# Patient Record
Sex: Female | Born: 1970
Health system: Southern US, Community
[De-identification: ages and names within clinical notes are randomized; demographics above are authoritative.]

## PROBLEM LIST (undated history)

## (undated) DIAGNOSIS — E7800 Pure hypercholesterolemia, unspecified: Secondary | ICD-10-CM

## (undated) DIAGNOSIS — K219 Gastro-esophageal reflux disease without esophagitis: Secondary | ICD-10-CM

## (undated) DIAGNOSIS — M1990 Unspecified osteoarthritis, unspecified site: Secondary | ICD-10-CM

## (undated) DIAGNOSIS — I2510 Atherosclerotic heart disease of native coronary artery without angina pectoris: Secondary | ICD-10-CM

## (undated) DIAGNOSIS — N920 Excessive and frequent menstruation with regular cycle: Secondary | ICD-10-CM

## (undated) DIAGNOSIS — I10 Essential (primary) hypertension: Secondary | ICD-10-CM

## (undated) DIAGNOSIS — J45909 Unspecified asthma, uncomplicated: Secondary | ICD-10-CM

## (undated) DIAGNOSIS — G4730 Sleep apnea, unspecified: Secondary | ICD-10-CM

## (undated) DIAGNOSIS — F329 Major depressive disorder, single episode, unspecified: Secondary | ICD-10-CM

## (undated) DIAGNOSIS — C801 Malignant (primary) neoplasm, unspecified: Secondary | ICD-10-CM

## (undated) DIAGNOSIS — G43909 Migraine, unspecified, not intractable, without status migrainosus: Secondary | ICD-10-CM

## (undated) DIAGNOSIS — I252 Old myocardial infarction: Secondary | ICD-10-CM

## (undated) DIAGNOSIS — F419 Anxiety disorder, unspecified: Secondary | ICD-10-CM

## (undated) DIAGNOSIS — Z460 Encounter for fitting and adjustment of spectacles and contact lenses: Secondary | ICD-10-CM

## (undated) DIAGNOSIS — E669 Obesity, unspecified: Secondary | ICD-10-CM

## (undated) DIAGNOSIS — R110 Nausea: Secondary | ICD-10-CM

## (undated) DIAGNOSIS — R0602 Shortness of breath: Secondary | ICD-10-CM

## (undated) DIAGNOSIS — G44099 Other trigeminal autonomic cephalgias (TAC), not intractable: Secondary | ICD-10-CM

## (undated) DIAGNOSIS — F32A Depression, unspecified: Secondary | ICD-10-CM

## (undated) DIAGNOSIS — G8929 Other chronic pain: Secondary | ICD-10-CM

## (undated) HISTORY — PX: BRAIN SURGERY: SHX531

## (undated) HISTORY — PX: ELBOW ARTHROSCOPY: SUR87

## (undated) HISTORY — PX: HAND RECONSTRUCTION: SHX1730

## (undated) HISTORY — PX: JOINT REPLACEMENT: SHX530

## (undated) HISTORY — PX: SHOULDER ARTHROSCOPY: SHX128

## (undated) HISTORY — DX: Excessive and frequent menstruation with regular cycle: N92.0

## (undated) HISTORY — PX: BREAST SURGERY: SHX581

## (undated) HISTORY — PX: KNEE ARTHROSCOPY: SHX127

## (undated) HISTORY — DX: Pure hypercholesterolemia, unspecified: E78.00

## (undated) HISTORY — DX: Other trigeminal autonomic cephalgias (tac), not intractable: G44.099

## (undated) HISTORY — PX: TUBAL LIGATION: SHX77

## (undated) HISTORY — DX: Obesity, unspecified: E66.9

---

## 2007-03-29 ENCOUNTER — Ambulatory Visit: Payer: Self-pay | Admitting: Cardiology

## 2007-12-18 ENCOUNTER — Emergency Department (HOSPITAL_COMMUNITY): Admission: EM | Admit: 2007-12-18 | Discharge: 2007-12-18 | Payer: Self-pay | Admitting: Emergency Medicine

## 2008-01-08 ENCOUNTER — Emergency Department (HOSPITAL_COMMUNITY): Admission: EM | Admit: 2008-01-08 | Discharge: 2008-01-08 | Payer: Self-pay | Admitting: Emergency Medicine

## 2008-04-22 ENCOUNTER — Emergency Department (HOSPITAL_COMMUNITY): Admission: EM | Admit: 2008-04-22 | Discharge: 2008-04-22 | Payer: Self-pay | Admitting: Emergency Medicine

## 2009-10-30 ENCOUNTER — Observation Stay (HOSPITAL_COMMUNITY): Admission: EM | Admit: 2009-10-30 | Discharge: 2009-11-01 | Payer: Self-pay | Admitting: Emergency Medicine

## 2009-10-31 ENCOUNTER — Ambulatory Visit: Payer: Self-pay | Admitting: Cardiology

## 2009-10-31 ENCOUNTER — Encounter (INDEPENDENT_AMBULATORY_CARE_PROVIDER_SITE_OTHER): Payer: Self-pay | Admitting: Internal Medicine

## 2010-04-24 LAB — LIPID PANEL
Cholesterol: 220 mg/dL — ABNORMAL HIGH (ref 0–200)
HDL: 52 mg/dL (ref >39)
LDL Cholesterol: 153 mg/dL — ABNORMAL HIGH (ref 0–99)
Total CHOL/HDL Ratio: 4.2 RATIO
Triglycerides: 76 mg/dL (ref <150)

## 2010-04-24 LAB — CARDIAC PANEL(CRET KIN+CKTOT+MB+TROPI)
CK, MB: 0.7 ng/mL (ref 0.3–4.0)
CK, MB: 0.8 ng/mL (ref 0.3–4.0)
CK, MB: 0.8 ng/mL (ref 0.3–4.0)
Relative Index: INVALID (ref 0.0–2.5)
Relative Index: INVALID (ref 0.0–2.5)
Total CK: 49 U/L (ref 7–177)
Troponin I: <0.01 ng/mL (ref 0.00–0.06)
Troponin I: <0.01 ng/mL (ref 0.00–0.06)

## 2010-04-24 LAB — APTT: aPTT: 29 seconds (ref 24–37)

## 2010-04-24 LAB — CBC
HCT: 38.2 % (ref 36.0–46.0)
MCH: 31 pg (ref 26.0–34.0)
MCV: 90.1 fL (ref 78.0–100.0)
Platelets: 233 10*3/uL (ref 150–400)
RBC: 4.24 MIL/uL (ref 3.87–5.11)
RDW: 13.2 % (ref 11.5–15.5)

## 2010-04-24 LAB — DIFFERENTIAL
Basophils Absolute: 0 10*3/uL (ref 0.0–0.1)
Basophils Relative: 1 % (ref 0–1)
Monocytes Absolute: 0.4 10*3/uL (ref 0.1–1.0)
Monocytes Relative: 8 % (ref 3–12)

## 2010-04-24 LAB — BASIC METABOLIC PANEL
CO2: 25 mEq/L (ref 19–32)
CO2: 26 mEq/L (ref 19–32)
Calcium: 8.8 mg/dL (ref 8.4–10.5)
GFR calc non Af Amer: >60 mL/min (ref >60)
Glucose, Bld: 94 mg/dL (ref 70–99)
Potassium: 3.8 mEq/L (ref 3.5–5.1)
Sodium: 137 mEq/L (ref 135–145)

## 2010-04-24 LAB — POCT CARDIAC MARKERS
CKMB, poc: <1 ng/mL — ABNORMAL LOW (ref 1.0–8.0)
Troponin i, poc: <0.05 ng/mL (ref 0.00–0.09)

## 2010-04-24 LAB — HEMOGLOBIN A1C
Hgb A1c MFr Bld: 6.3 % — ABNORMAL HIGH (ref <5.7)
Mean Plasma Glucose: 134 mg/dL — ABNORMAL HIGH (ref <117)

## 2010-05-01 ENCOUNTER — Emergency Department (HOSPITAL_COMMUNITY)
Admission: EM | Admit: 2010-05-01 | Discharge: 2010-05-01 | Disposition: A | Discharge status: Home / Self Care | Payer: Medicare Other | Attending: Emergency Medicine | Admitting: Emergency Medicine

## 2010-05-01 DIAGNOSIS — G8929 Other chronic pain: Secondary | ICD-10-CM | POA: Insufficient documentation

## 2010-05-01 DIAGNOSIS — I252 Old myocardial infarction: Secondary | ICD-10-CM | POA: Insufficient documentation

## 2010-05-01 DIAGNOSIS — I10 Essential (primary) hypertension: Secondary | ICD-10-CM | POA: Insufficient documentation

## 2010-05-01 DIAGNOSIS — IMO0002 Reserved for concepts with insufficient information to code with codable children: Secondary | ICD-10-CM | POA: Insufficient documentation

## 2010-05-01 DIAGNOSIS — Z79899 Other long term (current) drug therapy: Secondary | ICD-10-CM | POA: Insufficient documentation

## 2010-05-06 ENCOUNTER — Emergency Department (HOSPITAL_COMMUNITY)
Admission: EM | Admit: 2010-05-06 | Discharge: 2010-05-06 | Disposition: A | Discharge status: Home / Self Care | Payer: Medicare Other | Attending: Emergency Medicine | Admitting: Emergency Medicine

## 2010-05-06 DIAGNOSIS — I10 Essential (primary) hypertension: Secondary | ICD-10-CM | POA: Insufficient documentation

## 2010-05-06 DIAGNOSIS — IMO0002 Reserved for concepts with insufficient information to code with codable children: Secondary | ICD-10-CM | POA: Insufficient documentation

## 2010-05-06 DIAGNOSIS — I252 Old myocardial infarction: Secondary | ICD-10-CM | POA: Insufficient documentation

## 2010-05-06 DIAGNOSIS — Z79899 Other long term (current) drug therapy: Secondary | ICD-10-CM | POA: Insufficient documentation

## 2010-05-06 DIAGNOSIS — G8929 Other chronic pain: Secondary | ICD-10-CM | POA: Insufficient documentation

## 2010-08-17 ENCOUNTER — Other Ambulatory Visit: Payer: Self-pay

## 2010-08-17 ENCOUNTER — Emergency Department (HOSPITAL_COMMUNITY)
Admission: EM | Admit: 2010-08-17 | Discharge: 2010-08-18 | Disposition: A | Discharge status: Home / Self Care | Payer: Medicare Other | Attending: Emergency Medicine | Admitting: Emergency Medicine

## 2010-08-17 ENCOUNTER — Encounter: Payer: Self-pay | Admitting: *Deleted

## 2010-08-17 DIAGNOSIS — R209 Unspecified disturbances of skin sensation: Secondary | ICD-10-CM | POA: Insufficient documentation

## 2010-08-17 DIAGNOSIS — G43909 Migraine, unspecified, not intractable, without status migrainosus: Secondary | ICD-10-CM | POA: Insufficient documentation

## 2010-08-17 DIAGNOSIS — Z79899 Other long term (current) drug therapy: Secondary | ICD-10-CM | POA: Insufficient documentation

## 2010-08-17 DIAGNOSIS — I10 Essential (primary) hypertension: Secondary | ICD-10-CM | POA: Insufficient documentation

## 2010-08-17 DIAGNOSIS — R079 Chest pain, unspecified: Secondary | ICD-10-CM | POA: Insufficient documentation

## 2010-08-17 DIAGNOSIS — I2510 Atherosclerotic heart disease of native coronary artery without angina pectoris: Secondary | ICD-10-CM | POA: Insufficient documentation

## 2010-08-17 DIAGNOSIS — I252 Old myocardial infarction: Secondary | ICD-10-CM | POA: Insufficient documentation

## 2010-08-17 DIAGNOSIS — G629 Polyneuropathy, unspecified: Secondary | ICD-10-CM

## 2010-08-17 HISTORY — DX: Migraine, unspecified, not intractable, without status migrainosus: G43.909

## 2010-08-17 HISTORY — DX: Essential (primary) hypertension: I10

## 2010-08-17 HISTORY — DX: Atherosclerotic heart disease of native coronary artery without angina pectoris: I25.10

## 2010-08-17 HISTORY — DX: Malignant (primary) neoplasm, unspecified: C80.1

## 2010-08-17 HISTORY — DX: Old myocardial infarction: I25.2

## 2010-08-17 MED ORDER — SODIUM CHLORIDE 0.9 % IV SOLN
INTRAVENOUS | Status: DC
  Administered 2010-08-17: via INTRAVENOUS

## 2010-08-17 NOTE — ED Notes (Signed)
Pt has multiple cc, chest pain, headache, leg pain, generalized weaknwess

## 2010-08-18 ENCOUNTER — Encounter (HOSPITAL_COMMUNITY): Payer: Self-pay | Admitting: *Deleted

## 2010-08-18 ENCOUNTER — Emergency Department (HOSPITAL_COMMUNITY): Payer: Medicare Other

## 2010-08-18 LAB — BASIC METABOLIC PANEL
CO2: 27 mEq/L (ref 19–32)
Calcium: 9 mg/dL (ref 8.4–10.5)
Chloride: 103 mEq/L (ref 96–112)
GFR calc Af Amer: >60 mL/min (ref >60)
Glucose, Bld: 140 mg/dL — ABNORMAL HIGH (ref 70–99)

## 2010-08-18 LAB — CBC: MCHC: 33.1 g/dL (ref 30.0–36.0)

## 2010-08-18 LAB — CARDIAC PANEL(CRET KIN+CKTOT+MB+TROPI): Relative Index: INVALID (ref 0.0–2.5)

## 2010-08-18 MED ORDER — HYDROCODONE-ACETAMINOPHEN 5-325 MG PO TABS: 2.0000 | ORAL_TABLET | ORAL | Status: AC | PRN

## 2010-08-18 NOTE — Discharge Instructions (Signed)
FOLLOW UP WITH YOUR NEUROLOGIST IN NEXT FEW DAYS. RETURN FOR NEW OR WORSE CHEST PAIN.

## 2010-08-18 NOTE — ED Provider Notes (Signed)
History     Chief Complaint  Patient presents with  . Chest Pain   Patient is a 40 y.o. female presenting with chest pain. The history is provided by the patient.  Chest Pain The chest pain began 3 - 5 hours ago. Duration of episode(s) is 10 minutes. Chest pain occurs intermittently. The chest pain is resolved. At its most intense, the pain is at 5/10. The pain is currently at 0/10. The severity of the pain is moderate. The quality of the pain is described as sharp. The pain does not radiate. Pertinent negatives for primary symptoms include no fever, no syncope, no shortness of breath, no cough, no palpitations, no abdominal pain, no nausea, no vomiting and no dizziness.  Associated symptoms include numbness.  Pertinent negatives for associated symptoms include no weakness. Associated symptoms comments: AT 2030 ONSET OF LEFT SIDE OF BODY NUMBNESS AND TINGLING. . She tried nothing for the symptoms. Risk factors include obesity. Past medical history comments: HAD STRESS TEST IN 09 NEGATIVE. PRIOR ADMITS FOR CP WITH NEGAITVE RESULTS.   Her family medical history is significant for heart disease in family.     Past Medical History  Diagnosis Date  . Hypertension   . Cancer   . MI, old   . Coronary artery disease   . Migraine headache     Past Surgical History  Procedure Date  . Brain surgery   . Joint replacement   . Cesarean section     No family history on file.  History  Substance Use Topics  . Smoking status: Not on file  . Smokeless tobacco: Not on file  . Alcohol Use: No    OB History    Grav Para Term Preterm Abortions TAB SAB Ect Mult Living                  Review of Systems  Constitutional: Negative for fever.  HENT: Negative for neck pain.   Eyes: Negative for redness and visual disturbance.  Respiratory: Negative for cough and shortness of breath.   Cardiovascular: Positive for chest pain. Negative for palpitations and syncope.  Gastrointestinal: Negative  for nausea, vomiting and abdominal pain.  Genitourinary: Negative for dysuria.  Musculoskeletal: Negative for myalgias.  Skin: Negative for rash.  Neurological: Positive for numbness. Negative for dizziness and weakness.  Psychiatric/Behavioral: Negative for confusion.    Physical Exam  BP 108/83  Pulse 99  Resp 16  Ht 5\' 5"  (1.651 m)  Wt 300 lb (136.079 kg)  BMI 49.92 kg/m2  SpO2 98%  LMP 08/06/2010  Physical Exam  Constitutional: She is oriented to person, place, and time. She appears well-nourished.  HENT:  Head: Normocephalic and atraumatic.  Eyes: EOM are normal. Pupils are equal, round, and reactive to light.  Neck: Normal range of motion. Neck supple.  Cardiovascular: Normal rate, regular rhythm, normal heart sounds and intact distal pulses.   Pulmonary/Chest: Breath sounds normal. She has no wheezes. She exhibits no tenderness.  Abdominal: Soft. Bowel sounds are normal. There is no tenderness.  Musculoskeletal: Normal range of motion. She exhibits no edema and no tenderness.  Neurological: She is alert and oriented to person, place, and time. No cranial nerve deficit. Coordination normal.       SUBJECTIVE NUMBNESS TO RIGHT SIDE FACE NOT INCLUDED. MOTOR STRENGTH GOOD.   Skin: No rash noted.    ED Course  Date: 07/28/2010   Rate: 99  Rhythm: normal sinus rhythm  QRS Axis: normal  Intervals: normal  ST/T Wave abnormalities: normal  Conduction Disutrbances:none  Narrative Interpretation:   Old EKG Reviewed: none available   Procedures  MDM CHEST PAIN WORK UP NEGATIVE FOR ACUTE CARDIAC EVENT. CXR NEGATIVE HEAD CT WITHOUT ABNORMALITY. HAS NEUROLOGY FOLLOW UP.  NO SOB.       Shelda Jakes, MD 08/18/10 [phone]

## 2010-09-16 ENCOUNTER — Other Ambulatory Visit: Payer: Self-pay | Admitting: Neurology

## 2010-09-16 DIAGNOSIS — R200 Anesthesia of skin: Secondary | ICD-10-CM

## 2010-09-18 ENCOUNTER — Ambulatory Visit (HOSPITAL_COMMUNITY): Admission: RE | Admit: 2010-09-18 | Payer: Medicare Other | Source: Ambulatory Visit

## 2010-09-19 ENCOUNTER — Inpatient Hospital Stay (HOSPITAL_COMMUNITY): Admission: RE | Admit: 2010-09-19 | Payer: Medicare Other | Source: Ambulatory Visit

## 2010-11-11 LAB — CBC
HCT: 32.6 — ABNORMAL LOW
MCHC: 31.5
MCV: 69.1 — ABNORMAL LOW
Platelets: 355
WBC: 9.8

## 2010-11-11 LAB — PREGNANCY, URINE: Preg Test, Ur: NEGATIVE

## 2010-11-11 LAB — COMPREHENSIVE METABOLIC PANEL
Albumin: 3.5
BUN: 11
CO2: 24
Calcium: 8.7
Chloride: 102
Creatinine, Ser: 0.65
GFR calc non Af Amer: >60
Total Bilirubin: 0.2 — ABNORMAL LOW

## 2010-11-11 LAB — URINALYSIS, ROUTINE W REFLEX MICROSCOPIC
Bilirubin Urine: NEGATIVE
Leukocytes, UA: NEGATIVE
Nitrite: NEGATIVE
Specific Gravity, Urine: >1.03 — ABNORMAL HIGH
Urobilinogen, UA: 0.2
pH: 6

## 2010-11-11 LAB — DIFFERENTIAL
Basophils Absolute: 0
Lymphocytes Relative: 6 — ABNORMAL LOW
Neutro Abs: 8.8 — ABNORMAL HIGH

## 2010-11-11 LAB — LIPASE, BLOOD: Lipase: 41

## 2010-11-11 LAB — URINE MICROSCOPIC-ADD ON

## 2013-02-19 DIAGNOSIS — Z23 Encounter for immunization: Secondary | ICD-10-CM | POA: Diagnosis not present

## 2013-02-19 DIAGNOSIS — R0602 Shortness of breath: Secondary | ICD-10-CM | POA: Diagnosis not present

## 2013-02-19 DIAGNOSIS — Z885 Allergy status to narcotic agent status: Secondary | ICD-10-CM | POA: Diagnosis not present

## 2013-02-19 DIAGNOSIS — Z8701 Personal history of pneumonia (recurrent): Secondary | ICD-10-CM | POA: Diagnosis not present

## 2013-02-19 DIAGNOSIS — J441 Chronic obstructive pulmonary disease with (acute) exacerbation: Secondary | ICD-10-CM | POA: Diagnosis not present

## 2013-02-19 DIAGNOSIS — F329 Major depressive disorder, single episode, unspecified: Secondary | ICD-10-CM | POA: Diagnosis present

## 2013-02-19 DIAGNOSIS — Z6841 Body Mass Index (BMI) 40.0 and over, adult: Secondary | ICD-10-CM | POA: Diagnosis not present

## 2013-02-19 DIAGNOSIS — R0989 Other specified symptoms and signs involving the circulatory and respiratory systems: Secondary | ICD-10-CM | POA: Diagnosis not present

## 2013-02-19 DIAGNOSIS — E871 Hypo-osmolality and hyponatremia: Secondary | ICD-10-CM | POA: Diagnosis not present

## 2013-02-19 DIAGNOSIS — Z79899 Other long term (current) drug therapy: Secondary | ICD-10-CM | POA: Diagnosis not present

## 2013-02-19 DIAGNOSIS — N920 Excessive and frequent menstruation with regular cycle: Secondary | ICD-10-CM | POA: Diagnosis present

## 2013-02-19 DIAGNOSIS — K219 Gastro-esophageal reflux disease without esophagitis: Secondary | ICD-10-CM | POA: Diagnosis present

## 2013-02-19 DIAGNOSIS — Z809 Family history of malignant neoplasm, unspecified: Secondary | ICD-10-CM | POA: Diagnosis not present

## 2013-02-19 DIAGNOSIS — J984 Other disorders of lung: Secondary | ICD-10-CM | POA: Diagnosis not present

## 2013-02-19 DIAGNOSIS — R51 Headache: Secondary | ICD-10-CM | POA: Diagnosis not present

## 2013-02-19 DIAGNOSIS — G4733 Obstructive sleep apnea (adult) (pediatric): Secondary | ICD-10-CM | POA: Diagnosis not present

## 2013-02-19 DIAGNOSIS — J45901 Unspecified asthma with (acute) exacerbation: Secondary | ICD-10-CM | POA: Diagnosis not present

## 2013-02-19 DIAGNOSIS — E876 Hypokalemia: Secondary | ICD-10-CM | POA: Diagnosis not present

## 2013-02-19 DIAGNOSIS — F3289 Other specified depressive episodes: Secondary | ICD-10-CM | POA: Diagnosis present

## 2013-02-19 DIAGNOSIS — M1990 Unspecified osteoarthritis, unspecified site: Secondary | ICD-10-CM | POA: Diagnosis present

## 2013-02-19 DIAGNOSIS — Z880 Allergy status to penicillin: Secondary | ICD-10-CM | POA: Diagnosis not present

## 2013-02-19 DIAGNOSIS — R0609 Other forms of dyspnea: Secondary | ICD-10-CM | POA: Diagnosis not present

## 2013-03-07 DIAGNOSIS — G894 Chronic pain syndrome: Secondary | ICD-10-CM | POA: Diagnosis not present

## 2013-03-07 DIAGNOSIS — G500 Trigeminal neuralgia: Secondary | ICD-10-CM | POA: Diagnosis not present

## 2013-03-07 DIAGNOSIS — R51 Headache: Secondary | ICD-10-CM | POA: Diagnosis not present

## 2013-03-07 DIAGNOSIS — Z79899 Other long term (current) drug therapy: Secondary | ICD-10-CM | POA: Diagnosis not present

## 2013-03-08 DIAGNOSIS — M542 Cervicalgia: Secondary | ICD-10-CM | POA: Diagnosis not present

## 2013-03-08 DIAGNOSIS — M546 Pain in thoracic spine: Secondary | ICD-10-CM | POA: Diagnosis not present

## 2013-03-08 DIAGNOSIS — N62 Hypertrophy of breast: Secondary | ICD-10-CM | POA: Diagnosis not present

## 2013-03-09 DIAGNOSIS — G894 Chronic pain syndrome: Secondary | ICD-10-CM | POA: Diagnosis not present

## 2013-03-09 DIAGNOSIS — R51 Headache: Secondary | ICD-10-CM | POA: Diagnosis not present

## 2013-03-09 DIAGNOSIS — G500 Trigeminal neuralgia: Secondary | ICD-10-CM | POA: Diagnosis not present

## 2013-03-09 DIAGNOSIS — Z79899 Other long term (current) drug therapy: Secondary | ICD-10-CM | POA: Diagnosis not present

## 2013-04-11 DIAGNOSIS — G894 Chronic pain syndrome: Secondary | ICD-10-CM | POA: Diagnosis not present

## 2013-04-11 DIAGNOSIS — R51 Headache: Secondary | ICD-10-CM | POA: Diagnosis not present

## 2013-04-11 DIAGNOSIS — G500 Trigeminal neuralgia: Secondary | ICD-10-CM | POA: Diagnosis not present

## 2013-04-11 DIAGNOSIS — Z79899 Other long term (current) drug therapy: Secondary | ICD-10-CM | POA: Diagnosis not present

## 2013-05-17 DIAGNOSIS — Z79899 Other long term (current) drug therapy: Secondary | ICD-10-CM | POA: Diagnosis not present

## 2013-05-17 DIAGNOSIS — G43719 Chronic migraine without aura, intractable, without status migrainosus: Secondary | ICD-10-CM | POA: Diagnosis not present

## 2013-05-17 DIAGNOSIS — G500 Trigeminal neuralgia: Secondary | ICD-10-CM | POA: Diagnosis not present

## 2013-05-17 DIAGNOSIS — G894 Chronic pain syndrome: Secondary | ICD-10-CM | POA: Diagnosis not present

## 2013-06-12 DIAGNOSIS — Z79899 Other long term (current) drug therapy: Secondary | ICD-10-CM | POA: Diagnosis not present

## 2013-06-12 DIAGNOSIS — G894 Chronic pain syndrome: Secondary | ICD-10-CM | POA: Diagnosis not present

## 2013-06-12 DIAGNOSIS — R51 Headache: Secondary | ICD-10-CM | POA: Diagnosis not present

## 2013-06-12 DIAGNOSIS — I10 Essential (primary) hypertension: Secondary | ICD-10-CM | POA: Diagnosis not present

## 2013-06-14 DIAGNOSIS — Z79899 Other long term (current) drug therapy: Secondary | ICD-10-CM | POA: Diagnosis not present

## 2013-06-14 DIAGNOSIS — G500 Trigeminal neuralgia: Secondary | ICD-10-CM | POA: Diagnosis not present

## 2013-06-14 DIAGNOSIS — G43709 Chronic migraine without aura, not intractable, without status migrainosus: Secondary | ICD-10-CM | POA: Diagnosis not present

## 2013-06-14 DIAGNOSIS — G894 Chronic pain syndrome: Secondary | ICD-10-CM | POA: Diagnosis not present

## 2013-06-26 DIAGNOSIS — N61 Mastitis without abscess: Secondary | ICD-10-CM | POA: Diagnosis not present

## 2013-06-26 DIAGNOSIS — M542 Cervicalgia: Secondary | ICD-10-CM | POA: Diagnosis not present

## 2013-06-26 DIAGNOSIS — M546 Pain in thoracic spine: Secondary | ICD-10-CM | POA: Diagnosis not present

## 2013-06-27 DIAGNOSIS — M542 Cervicalgia: Secondary | ICD-10-CM | POA: Diagnosis not present

## 2013-06-27 DIAGNOSIS — M546 Pain in thoracic spine: Secondary | ICD-10-CM | POA: Diagnosis not present

## 2013-06-27 DIAGNOSIS — N62 Hypertrophy of breast: Secondary | ICD-10-CM | POA: Diagnosis not present

## 2013-07-06 ENCOUNTER — Encounter (HOSPITAL_BASED_OUTPATIENT_CLINIC_OR_DEPARTMENT_OTHER): Payer: Self-pay | Admitting: *Deleted

## 2013-07-06 NOTE — Progress Notes (Signed)
07/06/13 1655  OBSTRUCTIVE SLEEP APNEA  Have you ever been diagnosed with sleep apnea through a sleep study? Yes  If yes, do you have and use a CPAP or BPAP machine every night? 0  Do you snore loudly (loud enough to be heard through closed doors)?  1  Do you often feel tired, fatigued, or sleepy during the daytime? 1  Has anyone observed you stop breathing during your sleep? 1  Do you have, or are you being treated for high blood pressure? 1  BMI more than 35 kg/m2? 1  Age over 64 years old? 0  Neck circumference greater than 40 cm/16 inches? 1  Gender: 0  Obstructive Sleep Apnea Score 6  Score 4 or greater  Results sent to PCP

## 2013-07-06 NOTE — Progress Notes (Signed)
Pt finally retured call-saw cardiology 2011 with chest pains-never followed up-sees dr tapper in [place] -has been over a yr-sees pain dr monthly- Obese-has sleep apnea-does not use a cpap-smokes and sounds congested, denies anything unusual. Hx pneumonia last yr, To go to [facility] for bmet and ekg Will ck with anesthesia about pt-she would have to stay post op due to sleep apnea

## 2013-07-07 ENCOUNTER — Encounter (HOSPITAL_COMMUNITY): Payer: Medicare Other

## 2013-07-07 NOTE — Progress Notes (Signed)
Reviewed case with dr fitzgerald-pt heavy smoker,non-compliant sleep apnea -has cad-needs to be done main or-dr [place] office notified

## 2013-07-10 ENCOUNTER — Ambulatory Visit (HOSPITAL_BASED_OUTPATIENT_CLINIC_OR_DEPARTMENT_OTHER): Admission: RE | Admit: 2013-07-10 | Payer: Medicare Other | Source: Ambulatory Visit | Admitting: Specialist

## 2013-07-10 HISTORY — DX: Sleep apnea, unspecified: G47.30

## 2013-07-10 HISTORY — DX: Nausea: R11.0

## 2013-07-10 HISTORY — DX: Unspecified asthma, uncomplicated: J45.909

## 2013-07-10 HISTORY — DX: Major depressive disorder, single episode, unspecified: F32.9

## 2013-07-10 HISTORY — DX: Depression, unspecified: F32.A

## 2013-07-10 HISTORY — DX: Anxiety disorder, unspecified: F41.9

## 2013-07-10 HISTORY — DX: Encounter for fitting and adjustment of spectacles and contact lenses: Z46.0

## 2013-07-10 HISTORY — DX: Unspecified osteoarthritis, unspecified site: M19.90

## 2013-07-10 HISTORY — DX: Gastro-esophageal reflux disease without esophagitis: K21.9

## 2013-07-10 HISTORY — DX: Shortness of breath: R06.02

## 2013-07-10 HISTORY — DX: Other chronic pain: G89.29

## 2013-07-10 SURGERY — MAMMOPLASTY, REDUCTION
Anesthesia: General | Site: Breast | Laterality: Bilateral

## 2013-07-10 MED ORDER — MORPHINE SULFATE 10 MG/ML IJ SOLN
INTRAMUSCULAR | Status: AC
  Filled 2013-07-10: qty 1

## 2013-07-10 MED ORDER — MIDAZOLAM HCL 2 MG/2ML IJ SOLN
INTRAMUSCULAR | Status: AC
  Filled 2013-07-10: qty 2

## 2013-08-08 DIAGNOSIS — I10 Essential (primary) hypertension: Secondary | ICD-10-CM | POA: Diagnosis not present

## 2013-08-08 DIAGNOSIS — H7390 Unspecified disorder of tympanic membrane, unspecified ear: Secondary | ICD-10-CM | POA: Diagnosis not present

## 2013-08-08 DIAGNOSIS — F4321 Adjustment disorder with depressed mood: Secondary | ICD-10-CM | POA: Diagnosis not present

## 2013-08-08 DIAGNOSIS — E7800 Pure hypercholesterolemia, unspecified: Secondary | ICD-10-CM | POA: Diagnosis not present

## 2013-08-08 DIAGNOSIS — G4733 Obstructive sleep apnea (adult) (pediatric): Secondary | ICD-10-CM | POA: Diagnosis not present

## 2013-08-10 DIAGNOSIS — Z79899 Other long term (current) drug therapy: Secondary | ICD-10-CM | POA: Diagnosis not present

## 2013-08-10 DIAGNOSIS — G894 Chronic pain syndrome: Secondary | ICD-10-CM | POA: Diagnosis not present

## 2013-08-10 DIAGNOSIS — I10 Essential (primary) hypertension: Secondary | ICD-10-CM | POA: Diagnosis not present

## 2013-08-10 DIAGNOSIS — R51 Headache: Secondary | ICD-10-CM | POA: Diagnosis not present

## 2013-09-08 DIAGNOSIS — Z79899 Other long term (current) drug therapy: Secondary | ICD-10-CM | POA: Diagnosis not present

## 2013-09-08 DIAGNOSIS — G894 Chronic pain syndrome: Secondary | ICD-10-CM | POA: Diagnosis not present

## 2013-10-10 DIAGNOSIS — I10 Essential (primary) hypertension: Secondary | ICD-10-CM | POA: Diagnosis not present

## 2013-10-10 DIAGNOSIS — G894 Chronic pain syndrome: Secondary | ICD-10-CM | POA: Diagnosis not present

## 2013-10-10 DIAGNOSIS — Z79899 Other long term (current) drug therapy: Secondary | ICD-10-CM | POA: Diagnosis not present

## 2013-10-10 DIAGNOSIS — R51 Headache: Secondary | ICD-10-CM | POA: Diagnosis not present

## 2013-10-19 ENCOUNTER — Other Ambulatory Visit (HOSPITAL_COMMUNITY): Payer: Self-pay | Admitting: Respiratory Therapy

## 2013-10-19 DIAGNOSIS — G4730 Sleep apnea, unspecified: Secondary | ICD-10-CM

## 2013-10-20 DIAGNOSIS — R51 Headache: Secondary | ICD-10-CM | POA: Diagnosis not present

## 2013-10-20 DIAGNOSIS — G500 Trigeminal neuralgia: Secondary | ICD-10-CM | POA: Diagnosis not present

## 2013-10-24 DIAGNOSIS — Z79899 Other long term (current) drug therapy: Secondary | ICD-10-CM | POA: Diagnosis not present

## 2013-10-24 DIAGNOSIS — G8929 Other chronic pain: Secondary | ICD-10-CM | POA: Diagnosis not present

## 2013-10-24 DIAGNOSIS — R51 Headache: Secondary | ICD-10-CM | POA: Diagnosis not present

## 2013-11-06 DIAGNOSIS — I10 Essential (primary) hypertension: Secondary | ICD-10-CM | POA: Diagnosis not present

## 2013-11-06 DIAGNOSIS — Z79899 Other long term (current) drug therapy: Secondary | ICD-10-CM | POA: Diagnosis not present

## 2013-11-06 DIAGNOSIS — G894 Chronic pain syndrome: Secondary | ICD-10-CM | POA: Diagnosis not present

## 2013-11-06 DIAGNOSIS — R51 Headache: Secondary | ICD-10-CM | POA: Diagnosis not present

## 2013-12-04 DIAGNOSIS — Z79899 Other long term (current) drug therapy: Secondary | ICD-10-CM | POA: Diagnosis not present

## 2013-12-04 DIAGNOSIS — I10 Essential (primary) hypertension: Secondary | ICD-10-CM | POA: Diagnosis not present

## 2013-12-04 DIAGNOSIS — G4459 Other complicated headache syndrome: Secondary | ICD-10-CM | POA: Diagnosis not present

## 2013-12-04 DIAGNOSIS — G894 Chronic pain syndrome: Secondary | ICD-10-CM | POA: Diagnosis not present

## 2014-01-01 DIAGNOSIS — G500 Trigeminal neuralgia: Secondary | ICD-10-CM | POA: Diagnosis not present

## 2014-01-01 DIAGNOSIS — Z79899 Other long term (current) drug therapy: Secondary | ICD-10-CM | POA: Diagnosis not present

## 2014-01-01 DIAGNOSIS — G4459 Other complicated headache syndrome: Secondary | ICD-10-CM | POA: Diagnosis not present

## 2014-01-01 DIAGNOSIS — I10 Essential (primary) hypertension: Secondary | ICD-10-CM | POA: Diagnosis not present

## 2014-01-29 DIAGNOSIS — G4459 Other complicated headache syndrome: Secondary | ICD-10-CM | POA: Diagnosis not present

## 2014-01-29 DIAGNOSIS — G500 Trigeminal neuralgia: Secondary | ICD-10-CM | POA: Diagnosis not present

## 2014-01-29 DIAGNOSIS — I10 Essential (primary) hypertension: Secondary | ICD-10-CM | POA: Diagnosis not present

## 2014-01-29 DIAGNOSIS — Z79899 Other long term (current) drug therapy: Secondary | ICD-10-CM | POA: Diagnosis not present

## 2014-03-05 DIAGNOSIS — Z79899 Other long term (current) drug therapy: Secondary | ICD-10-CM | POA: Diagnosis not present

## 2014-03-05 DIAGNOSIS — G500 Trigeminal neuralgia: Secondary | ICD-10-CM | POA: Diagnosis not present

## 2014-03-05 DIAGNOSIS — I10 Essential (primary) hypertension: Secondary | ICD-10-CM | POA: Diagnosis not present

## 2014-03-05 DIAGNOSIS — G4459 Other complicated headache syndrome: Secondary | ICD-10-CM | POA: Diagnosis not present

## 2014-04-02 DIAGNOSIS — G500 Trigeminal neuralgia: Secondary | ICD-10-CM | POA: Diagnosis not present

## 2014-04-02 DIAGNOSIS — I10 Essential (primary) hypertension: Secondary | ICD-10-CM | POA: Diagnosis not present

## 2014-04-02 DIAGNOSIS — G4459 Other complicated headache syndrome: Secondary | ICD-10-CM | POA: Diagnosis not present

## 2014-04-02 DIAGNOSIS — Z79899 Other long term (current) drug therapy: Secondary | ICD-10-CM | POA: Diagnosis not present

## 2014-04-23 ENCOUNTER — Encounter: Payer: Self-pay | Admitting: *Deleted

## 2014-04-25 ENCOUNTER — Encounter: Payer: Self-pay | Admitting: Obstetrics and Gynecology

## 2014-05-01 DIAGNOSIS — I10 Essential (primary) hypertension: Secondary | ICD-10-CM | POA: Diagnosis not present

## 2014-05-01 DIAGNOSIS — G4459 Other complicated headache syndrome: Secondary | ICD-10-CM | POA: Diagnosis not present

## 2014-05-01 DIAGNOSIS — Z79899 Other long term (current) drug therapy: Secondary | ICD-10-CM | POA: Diagnosis not present

## 2014-05-01 DIAGNOSIS — G500 Trigeminal neuralgia: Secondary | ICD-10-CM | POA: Diagnosis not present

## 2014-05-29 DIAGNOSIS — I10 Essential (primary) hypertension: Secondary | ICD-10-CM | POA: Diagnosis not present

## 2014-05-29 DIAGNOSIS — G894 Chronic pain syndrome: Secondary | ICD-10-CM | POA: Diagnosis not present

## 2014-05-29 DIAGNOSIS — G4459 Other complicated headache syndrome: Secondary | ICD-10-CM | POA: Diagnosis not present

## 2014-05-29 DIAGNOSIS — Z79899 Other long term (current) drug therapy: Secondary | ICD-10-CM | POA: Diagnosis not present

## 2014-05-29 DIAGNOSIS — G500 Trigeminal neuralgia: Secondary | ICD-10-CM | POA: Diagnosis not present

## 2014-06-08 ENCOUNTER — Encounter (HOSPITAL_COMMUNITY): Payer: Self-pay | Admitting: Emergency Medicine

## 2014-06-08 ENCOUNTER — Emergency Department (HOSPITAL_COMMUNITY): Payer: Medicare Other

## 2014-06-08 ENCOUNTER — Inpatient Hospital Stay (HOSPITAL_COMMUNITY)
Admission: EM | Admit: 2014-06-08 | Discharge: 2014-06-12 | DRG: 640 | Disposition: A | Discharge status: Home / Self Care | Payer: Medicare Other | Attending: Internal Medicine | Admitting: Internal Medicine

## 2014-06-08 DIAGNOSIS — R410 Disorientation, unspecified: Secondary | ICD-10-CM | POA: Diagnosis not present

## 2014-06-08 DIAGNOSIS — E780 Pure hypercholesterolemia: Secondary | ICD-10-CM | POA: Diagnosis present

## 2014-06-08 DIAGNOSIS — F329 Major depressive disorder, single episode, unspecified: Secondary | ICD-10-CM | POA: Diagnosis present

## 2014-06-08 DIAGNOSIS — K219 Gastro-esophageal reflux disease without esophagitis: Secondary | ICD-10-CM | POA: Diagnosis present

## 2014-06-08 DIAGNOSIS — T502X5A Adverse effect of carbonic-anhydrase inhibitors, benzothiadiazides and other diuretics, initial encounter: Secondary | ICD-10-CM | POA: Diagnosis present

## 2014-06-08 DIAGNOSIS — M1990 Unspecified osteoarthritis, unspecified site: Secondary | ICD-10-CM | POA: Diagnosis present

## 2014-06-08 DIAGNOSIS — I2510 Atherosclerotic heart disease of native coronary artery without angina pectoris: Secondary | ICD-10-CM | POA: Diagnosis present

## 2014-06-08 DIAGNOSIS — I10 Essential (primary) hypertension: Secondary | ICD-10-CM | POA: Diagnosis present

## 2014-06-08 DIAGNOSIS — E876 Hypokalemia: Secondary | ICD-10-CM | POA: Diagnosis not present

## 2014-06-08 DIAGNOSIS — I639 Cerebral infarction, unspecified: Secondary | ICD-10-CM

## 2014-06-08 DIAGNOSIS — E871 Hypo-osmolality and hyponatremia: Principal | ICD-10-CM | POA: Diagnosis present

## 2014-06-08 DIAGNOSIS — R2681 Unsteadiness on feet: Secondary | ICD-10-CM | POA: Diagnosis present

## 2014-06-08 DIAGNOSIS — Z87891 Personal history of nicotine dependence: Secondary | ICD-10-CM

## 2014-06-08 DIAGNOSIS — I252 Old myocardial infarction: Secondary | ICD-10-CM | POA: Diagnosis not present

## 2014-06-08 DIAGNOSIS — J45909 Unspecified asthma, uncomplicated: Secondary | ICD-10-CM | POA: Diagnosis present

## 2014-06-08 DIAGNOSIS — R4781 Slurred speech: Secondary | ICD-10-CM | POA: Diagnosis not present

## 2014-06-08 DIAGNOSIS — G4459 Other complicated headache syndrome: Secondary | ICD-10-CM | POA: Diagnosis not present

## 2014-06-08 DIAGNOSIS — G8929 Other chronic pain: Secondary | ICD-10-CM | POA: Diagnosis present

## 2014-06-08 DIAGNOSIS — D509 Iron deficiency anemia, unspecified: Secondary | ICD-10-CM | POA: Diagnosis present

## 2014-06-08 DIAGNOSIS — T426X5A Adverse effect of other antiepileptic and sedative-hypnotic drugs, initial encounter: Secondary | ICD-10-CM | POA: Diagnosis present

## 2014-06-08 DIAGNOSIS — G500 Trigeminal neuralgia: Secondary | ICD-10-CM | POA: Diagnosis not present

## 2014-06-08 DIAGNOSIS — Z8249 Family history of ischemic heart disease and other diseases of the circulatory system: Secondary | ICD-10-CM

## 2014-06-08 DIAGNOSIS — T424X5A Adverse effect of benzodiazepines, initial encounter: Secondary | ICD-10-CM | POA: Diagnosis present

## 2014-06-08 DIAGNOSIS — G92 Toxic encephalopathy: Secondary | ICD-10-CM | POA: Diagnosis present

## 2014-06-08 DIAGNOSIS — R269 Unspecified abnormalities of gait and mobility: Secondary | ICD-10-CM | POA: Diagnosis not present

## 2014-06-08 DIAGNOSIS — R29810 Facial weakness: Secondary | ICD-10-CM | POA: Diagnosis not present

## 2014-06-08 DIAGNOSIS — R51 Headache: Secondary | ICD-10-CM | POA: Diagnosis not present

## 2014-06-08 DIAGNOSIS — R4182 Altered mental status, unspecified: Secondary | ICD-10-CM | POA: Diagnosis not present

## 2014-06-08 DIAGNOSIS — G9340 Encephalopathy, unspecified: Secondary | ICD-10-CM | POA: Diagnosis not present

## 2014-06-08 DIAGNOSIS — T402X5A Adverse effect of other opioids, initial encounter: Secondary | ICD-10-CM | POA: Diagnosis present

## 2014-06-08 DIAGNOSIS — R42 Dizziness and giddiness: Secondary | ICD-10-CM | POA: Diagnosis not present

## 2014-06-08 LAB — I-STAT CHEM 8, ED
BUN: 9 mg/dL (ref 6–23)
CREATININE: 0.6 mg/dL (ref 0.50–1.10)
Calcium, Ion: 1.12 mmol/L (ref 1.12–1.23)
Chloride: 86 mmol/L — ABNORMAL LOW (ref 96–112)
Glucose, Bld: 111 mg/dL — ABNORMAL HIGH (ref 70–99)
HCT: 35 % — ABNORMAL LOW (ref 36.0–46.0)
HEMOGLOBIN: 11.9 g/dL — AB (ref 12.0–15.0)
Potassium: 2.9 mmol/L — ABNORMAL LOW (ref 3.5–5.1)
Sodium: 120 mmol/L — ABNORMAL LOW (ref 135–145)
TCO2: 21 mmol/L (ref 0–100)

## 2014-06-08 LAB — CBC
HEMATOCRIT: 31.7 % — AB (ref 36.0–46.0)
Hemoglobin: 10.3 g/dL — ABNORMAL LOW (ref 12.0–15.0)
MCH: 23.3 pg — ABNORMAL LOW (ref 26.0–34.0)
MCHC: 32.5 g/dL (ref 30.0–36.0)
MCV: 71.6 fL — AB (ref 78.0–100.0)
PLATELETS: 343 10*3/uL (ref 150–400)
RBC: 4.43 MIL/uL (ref 3.87–5.11)
RDW: 15.3 % (ref 11.5–15.5)
WBC: 8.2 10*3/uL (ref 4.0–10.5)

## 2014-06-08 LAB — URINALYSIS, ROUTINE W REFLEX MICROSCOPIC
Bilirubin Urine: NEGATIVE
GLUCOSE, UA: NEGATIVE mg/dL
HGB URINE DIPSTICK: NEGATIVE
KETONES UR: NEGATIVE mg/dL
Leukocytes, UA: NEGATIVE
Nitrite: NEGATIVE
PH: 7 (ref 5.0–8.0)
Protein, ur: NEGATIVE mg/dL
Specific Gravity, Urine: 1.015 (ref 1.005–1.030)
Urobilinogen, UA: 1 mg/dL (ref 0.0–1.0)

## 2014-06-08 LAB — RAPID URINE DRUG SCREEN, HOSP PERFORMED
AMPHETAMINES: NOT DETECTED
Barbiturates: NOT DETECTED
Benzodiazepines: NOT DETECTED
COCAINE: NOT DETECTED
OPIATES: NOT DETECTED
Tetrahydrocannabinol: NOT DETECTED

## 2014-06-08 LAB — DIFFERENTIAL
BASOS PCT: 0 % (ref 0–1)
Basophils Absolute: 0 10*3/uL (ref 0.0–0.1)
EOS ABS: 0.1 10*3/uL (ref 0.0–0.7)
Eosinophils Relative: 1 % (ref 0–5)
Lymphocytes Relative: 17 % (ref 12–46)
Lymphs Abs: 1.4 10*3/uL (ref 0.7–4.0)
MONOS PCT: 7 % (ref 3–12)
Monocytes Absolute: 0.6 10*3/uL (ref 0.1–1.0)
NEUTROS ABS: 6.2 10*3/uL (ref 1.7–7.7)
Neutrophils Relative %: 75 % (ref 43–77)

## 2014-06-08 LAB — COMPREHENSIVE METABOLIC PANEL
ALK PHOS: 70 U/L (ref 39–117)
ALT: 14 U/L (ref 0–35)
AST: 17 U/L (ref 0–37)
Albumin: 3.8 g/dL (ref 3.5–5.2)
Anion gap: 9 (ref 5–15)
BILIRUBIN TOTAL: 0.4 mg/dL (ref 0.3–1.2)
BUN: 10 mg/dL (ref 6–23)
CO2: 25 mmol/L (ref 19–32)
CREATININE: 0.62 mg/dL (ref 0.50–1.10)
Calcium: 8.6 mg/dL (ref 8.4–10.5)
Chloride: 86 mmol/L — ABNORMAL LOW (ref 96–112)
Glucose, Bld: 109 mg/dL — ABNORMAL HIGH (ref 70–99)
Potassium: 3 mmol/L — ABNORMAL LOW (ref 3.5–5.1)
Sodium: 120 mmol/L — ABNORMAL LOW (ref 135–145)
Total Protein: 7.1 g/dL (ref 6.0–8.3)

## 2014-06-08 LAB — I-STAT TROPONIN, ED: Troponin i, poc: 0 ng/mL (ref 0.00–0.08)

## 2014-06-08 LAB — PROTIME-INR
INR: 0.99 (ref 0.00–1.49)
Prothrombin Time: 13.2 seconds (ref 11.6–15.2)

## 2014-06-08 LAB — ETHANOL: Alcohol, Ethyl (B): <5 mg/dL (ref 0–9)

## 2014-06-08 LAB — TSH: TSH: 2.091 u[IU]/mL (ref 0.350–4.500)

## 2014-06-08 LAB — APTT: aPTT: 32 seconds (ref 24–37)

## 2014-06-08 MED ORDER — POTASSIUM CHLORIDE CRYS ER 20 MEQ PO TBCR
40.0000 meq | EXTENDED_RELEASE_TABLET | Freq: Once | ORAL | Status: AC
  Administered 2014-06-08: 40 meq via ORAL
  Filled 2014-06-08: qty 2

## 2014-06-08 MED ORDER — PREGABALIN 75 MG PO CAPS
200.0000 mg | ORAL_CAPSULE | Freq: Two times a day (BID) | ORAL | Status: DC
  Administered 2014-06-08 – 2014-06-12 (×8): 200 mg via ORAL
  Filled 2014-06-08 (×2): qty 1
  Filled 2014-06-08 (×2): qty 2
  Filled 2014-06-08 (×2): qty 1
  Filled 2014-06-08 (×2): qty 2
  Filled 2014-06-08: qty 1
  Filled 2014-06-08 (×3): qty 2
  Filled 2014-06-08 (×2): qty 1
  Filled 2014-06-08: qty 2
  Filled 2014-06-08 (×2): qty 1

## 2014-06-08 MED ORDER — ONDANSETRON HCL 4 MG/2ML IJ SOLN
4.0000 mg | Freq: Four times a day (QID) | INTRAMUSCULAR | Status: DC | PRN
  Administered 2014-06-11: 4 mg via INTRAVENOUS
  Filled 2014-06-08: qty 2

## 2014-06-08 MED ORDER — PROMETHAZINE HCL 12.5 MG PO TABS
25.0000 mg | ORAL_TABLET | Freq: Four times a day (QID) | ORAL | Status: DC | PRN
  Administered 2014-06-08: 25 mg via ORAL
  Filled 2014-06-08: qty 2

## 2014-06-08 MED ORDER — PANTOPRAZOLE SODIUM 40 MG PO TBEC: 40.0000 mg | DELAYED_RELEASE_TABLET | Freq: Every day | ORAL | Status: DC

## 2014-06-08 MED ORDER — LORAZEPAM 0.5 MG PO TABS: 0.5000 mg | ORAL_TABLET | Freq: Four times a day (QID) | ORAL | Status: DC | PRN

## 2014-06-08 MED ORDER — SUMATRIPTAN SUCCINATE 6 MG/0.5ML ~~LOC~~ SOCT
0.5000 mL | SUBCUTANEOUS | Status: DC | PRN
  Filled 2014-06-08: qty 0.5

## 2014-06-08 MED ORDER — DULOXETINE HCL 60 MG PO CPEP
60.0000 mg | ORAL_CAPSULE | Freq: Every day | ORAL | Status: DC
  Administered 2014-06-09 – 2014-06-12 (×4): 60 mg via ORAL
  Filled 2014-06-08 (×4): qty 1

## 2014-06-08 MED ORDER — OXCARBAZEPINE 300 MG PO TABS
ORAL_TABLET | ORAL | Status: AC
  Filled 2014-06-08: qty 4

## 2014-06-08 MED ORDER — SODIUM CHLORIDE 0.9 % IV SOLN: INTRAVENOUS | Status: DC

## 2014-06-08 MED ORDER — OXCARBAZEPINE 300 MG PO TABS
1200.0000 mg | ORAL_TABLET | Freq: Two times a day (BID) | ORAL | Status: DC
  Administered 2014-06-08 – 2014-06-12 (×8): 1200 mg via ORAL
  Filled 2014-06-08 (×10): qty 4

## 2014-06-08 MED ORDER — TOPIRAMATE 100 MG PO TABS
100.0000 mg | ORAL_TABLET | Freq: Two times a day (BID) | ORAL | Status: DC
  Administered 2014-06-08 – 2014-06-12 (×8): 100 mg via ORAL
  Filled 2014-06-08 (×10): qty 1

## 2014-06-08 MED ORDER — PANTOPRAZOLE SODIUM 40 MG PO TBEC
80.0000 mg | DELAYED_RELEASE_TABLET | Freq: Every day | ORAL | Status: DC
  Administered 2014-06-08 – 2014-06-12 (×5): 80 mg via ORAL
  Filled 2014-06-08 (×5): qty 2

## 2014-06-08 MED ORDER — SODIUM CHLORIDE 0.9 % IV BOLUS (SEPSIS)
1000.0000 mL | Freq: Once | INTRAVENOUS | Status: AC
  Administered 2014-06-08: 1000 mL via INTRAVENOUS

## 2014-06-08 MED ORDER — OXYCODONE HCL 5 MG PO TABS
15.0000 mg | ORAL_TABLET | Freq: Four times a day (QID) | ORAL | Status: DC | PRN
  Administered 2014-06-08: 15 mg via ORAL
  Filled 2014-06-08: qty 3

## 2014-06-08 MED ORDER — SODIUM CHLORIDE 0.9 % IV SOLN
INTRAVENOUS | Status: DC
  Administered 2014-06-08 – 2014-06-11 (×6): via INTRAVENOUS

## 2014-06-08 MED ORDER — HEPARIN SODIUM (PORCINE) 5000 UNIT/ML IJ SOLN
5000.0000 [IU] | Freq: Three times a day (TID) | INTRAMUSCULAR | Status: DC
  Administered 2014-06-08 – 2014-06-12 (×11): 5000 [IU] via SUBCUTANEOUS
  Filled 2014-06-08 (×11): qty 1

## 2014-06-08 MED ORDER — OXYCODONE HCL 5 MG PO TABS: 15.0000 mg | ORAL_TABLET | Freq: Four times a day (QID) | ORAL | Status: DC | PRN

## 2014-06-08 MED ORDER — LORATADINE 10 MG PO TABS
10.0000 mg | ORAL_TABLET | Freq: Every day | ORAL | Status: DC
  Administered 2014-06-09 – 2014-06-12 (×4): 10 mg via ORAL
  Filled 2014-06-08 (×4): qty 1

## 2014-06-08 MED ORDER — SIMVASTATIN 20 MG PO TABS
40.0000 mg | ORAL_TABLET | Freq: Every day | ORAL | Status: DC
  Administered 2014-06-08 – 2014-06-11 (×4): 40 mg via ORAL
  Filled 2014-06-08 (×4): qty 2

## 2014-06-08 MED ORDER — METHOCARBAMOL 500 MG PO TABS
750.0000 mg | ORAL_TABLET | Freq: Three times a day (TID) | ORAL | Status: DC
  Administered 2014-06-08: 750 mg via ORAL
  Filled 2014-06-08: qty 2

## 2014-06-08 MED ORDER — ONDANSETRON HCL 4 MG PO TABS
4.0000 mg | ORAL_TABLET | Freq: Four times a day (QID) | ORAL | Status: DC | PRN
  Administered 2014-06-09 – 2014-06-12 (×6): 4 mg via ORAL
  Filled 2014-06-08 (×6): qty 1
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
## 2014-06-08 NOTE — ED Notes (Signed)
Per MD do not need to activate code stroke for tele neurology due to pt being so far out of timeframe.  MD assessed pt upon arrival to ED.  Pt is alert and oriented and states that she has been off balance for the past week or so with dizziness and drooling at times.  Pt exhibiting no weakness or sensory deficit.

## 2014-06-08 NOTE — Progress Notes (Signed)
CODE STROKE CT BEEPER 1417 CALLED [facility] AND SENT IMAGES TO [place] @ 1898

## 2014-06-08 NOTE — ED Notes (Signed)
Pt states that she needs to urinate but does not want any staff assistance.  Advised pt that due to her stating that she cannot walk she cannot get up and needs to use a bedpan at this time.  Pt states that she wants her friends to assist.
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
## 2014-06-08 NOTE — ED Notes (Signed)
Pt states that she had a navy cross on her necklace.  States that her cross is missing but the chain it was on and her earrings are in the bag that was given to her.  Jewelry was removed in CT by CT tech.

## 2014-06-08 NOTE — ED Notes (Signed)
Patient sent from Dr Merlene Laughter for possible stroke. Patient complaining of dizziness, confusion, slurred speech, and facial droop x 4 days.

## 2014-06-08 NOTE — ED Notes (Signed)
Dr. Zackowski at bedside  

## 2014-06-08 NOTE — ED Notes (Signed)
Pt states she was unable to complete the MRI because she was too large for the scanner and "got stuck" per pt.  MD aware.

## 2014-06-09 LAB — COMPREHENSIVE METABOLIC PANEL
ALBUMIN: 3.1 g/dL — AB (ref 3.5–5.2)
ALT: 12 U/L (ref 0–35)
ANION GAP: 7 (ref 5–15)
AST: 12 U/L (ref 0–37)
Alkaline Phosphatase: 60 U/L (ref 39–117)
BUN: 8 mg/dL (ref 6–23)
CALCIUM: 8.1 mg/dL — AB (ref 8.4–10.5)
CO2: 25 mmol/L (ref 19–32)
CREATININE: 0.6 mg/dL (ref 0.50–1.10)
Chloride: 91 mmol/L — ABNORMAL LOW (ref 96–112)
GFR calc Af Amer: >90 mL/min (ref >90)
Glucose, Bld: 102 mg/dL — ABNORMAL HIGH (ref 70–99)
Potassium: 3.1 mmol/L — ABNORMAL LOW (ref 3.5–5.1)
Sodium: 123 mmol/L — ABNORMAL LOW (ref 135–145)
Total Bilirubin: 0.2 mg/dL — ABNORMAL LOW (ref 0.3–1.2)
Total Protein: 6 g/dL (ref 6.0–8.3)

## 2014-06-09 LAB — CBC
HCT: 28.2 % — ABNORMAL LOW (ref 36.0–46.0)
Hemoglobin: 9.4 g/dL — ABNORMAL LOW (ref 12.0–15.0)
MCH: 23.9 pg — ABNORMAL LOW (ref 26.0–34.0)
MCHC: 33.3 g/dL (ref 30.0–36.0)
MCV: 71.8 fL — AB (ref 78.0–100.0)
PLATELETS: 302 10*3/uL (ref 150–400)
RBC: 3.93 MIL/uL (ref 3.87–5.11)
RDW: 15.2 % (ref 11.5–15.5)
WBC: 7.7 10*3/uL (ref 4.0–10.5)

## 2014-06-09 MED ORDER — POTASSIUM CHLORIDE CRYS ER 20 MEQ PO TBCR
40.0000 meq | EXTENDED_RELEASE_TABLET | Freq: Two times a day (BID) | ORAL | Status: AC
  Administered 2014-06-09 (×2): 40 meq via ORAL
  Filled 2014-06-09 (×2): qty 2

## 2014-06-09 MED ORDER — LORAZEPAM 0.5 MG PO TABS: 0.5000 mg | ORAL_TABLET | Freq: Two times a day (BID) | ORAL | Status: DC | PRN

## 2014-06-09 MED ORDER — OXYCODONE HCL 5 MG PO TABS: 5.0000 mg | ORAL_TABLET | Freq: Four times a day (QID) | ORAL | Status: DC | PRN

## 2014-06-09 MED ORDER — SUMATRIPTAN SUCCINATE 6 MG/0.5ML ~~LOC~~ SOLN
6.0000 mg | SUBCUTANEOUS | Status: DC | PRN
  Filled 2014-06-09: qty 0.5

## 2014-06-09 NOTE — Progress Notes (Addendum)
[facility] PROGRESS NOTE  Assessment/Plan: Hyponatremia - She is on 2 diuretics at home, which might make her intravascularly depleted. - He started on aggressive IV fluid hydration check a basic metabolic panel this afternoon.   Acute encephalopathy: Likely due  medication induced, she is on oxycodone, Robaxin, Phenergan and benzodiazepines.  DC narcotics change benzodiazepines 4 hours to every 12 hours. - Response to pain because back to sleep.     Hypokalemia - likely due to hypovolemia repeat orally recheck in the morning.    Essential Hypertension - She is on antihypertensive medication and blood pressure is at goal.  Unsteady gait -  CT is negative, she is not a diabetic. Her weight of an MRI of her head. - Likely a stroke is mostly due to hyponatremia. - PT consult.      Code Status: full Family Communication: none  Disposition Plan: home in 24 hrs   Consultants:  none  Procedures:  CT head  Antibiotics:  None  HPI/Subjective: patient is sleepy but able to answer question or carry on a conversation.  Objective: Filed Vitals:   06/08/14 2030 06/08/14 2204 06/09/14 0153 06/09/14 0622  BP: 113/44 102/49 93/50 102/50  Pulse:  68 80 64  Temp: 98.2 F (36.8 C) 97.4 F (36.3 C) 95.3 F (35.2 C) 98.1 F (36.7 C)  TempSrc: Oral Oral Oral Oral  Resp: 20 20 20 20   Height:      Weight:      SpO2: 100% 100% 100% 99%    Intake/Output Summary (Last 24 hours) at 06/09/14 0847 Last data filed at 06/09/14 0600  Gross per 24 hour  Intake 2128.33 ml  Output    300 ml  Net 1828.33 ml   Filed Weights   06/08/14 1413 06/08/14 1850  Weight: 124.739 kg (275 lb) 132.586 kg (292 lb 4.8 oz)    Exam:  General: in no acute distress.  oversedated  HEENT: No bruits, no goiter.  Heart: Regular rate and rhythm. Lungs: Good air movement, clear Abdomen: Soft, nontender, nondistended, positive bowel sounds.  Neuro: Grossly intact, nonfocal.   Data  Reviewed: Basic Metabolic Panel:  Recent Labs Lab 06/08/14 1414 06/08/14 1433 06/09/14 0558  NA 120* 120* 123*  K 3.0* 2.9* 3.1*  CL 86* 86* 91*  CO2 25  --  25  GLUCOSE 109* 111* 102*  BUN 10 9 8   CREATININE 0.62 0.60 0.60  CALCIUM 8.6  --  8.1*   Liver Function Tests:  Recent Labs Lab 06/08/14 1414 06/09/14 0558  AST 17 12  ALT 14 12  ALKPHOS 70 60  BILITOT 0.4 0.2*  PROT 7.1 6.0  ALBUMIN 3.8 3.1*   No results for input(s): LIPASE, AMYLASE in the last 168 hours. No results for input(s): AMMONIA in the last 168 hours. CBC:  Recent Labs Lab 06/08/14 1414 06/08/14 1433 06/09/14 0558  WBC 8.2  --  7.7  NEUTROABS 6.2  --   --   HGB 10.3* 11.9* 9.4*  HCT 31.7* 35.0* 28.2*  MCV 71.6*  --  71.8*  PLT 343  --  302   Cardiac Enzymes: No results for input(s): CKTOTAL, CKMB, CKMBINDEX, TROPONINI in the last 168 hours. BNP (last 3 results) No results for input(s): BNP in the last 8760 hours.  ProBNP (last 3 results) No results for input(s): PROBNP in the last 8760 hours.  CBG: No results for input(s): GLUCAP in the last 168 hours.  No results found for this or any previous  visit (from the past 240 hour(s)).   Studies: Ct Head Wo Contrast  06/08/2014   CLINICAL DATA:  Dizziness, confusion, slurred speech, and facial droop for 4 days. Code stroke.  EXAM: CT HEAD WITHOUT CONTRAST  TECHNIQUE: Contiguous axial images were obtained from the base of the skull through the vertex without intravenous contrast.  COMPARISON:  02/19/2013  FINDINGS: The ventricles and sulci are within normal limits for age. There is no evidence of acute infarct, intracranial hemorrhage, mass, midline shift, or extra-axial collection.  The orbits are unremarkable. Mild left maxillary sinus mucosal thickening is partially visualized. Mastoid air cells are clear. There is no evidence of acute fracture.  IMPRESSION: Unremarkable CT appearance of the brain.  These results were called by telephone at  the time of interpretation on 06/08/2014 at 2:36 pm to Dr. Fredia Sorrow , who verbally acknowledged these results.   Electronically Signed   By: Logan Bores   On: 06/08/2014 14:38    Scheduled Meds: . DULoxetine  60 mg Oral Daily  . heparin  5,000 Units Subcutaneous 3 times per day  . loratadine  10 mg Oral Daily  . methocarbamol  750 mg Oral TID  . Oxcarbazepine  1,200 mg Oral BID  . pantoprazole  80 mg Oral Daily  . pregabalin  200 mg Oral BID  . simvastatin  40 mg Oral QHS  . topiramate  100 mg Oral BID   Continuous Infusions: . sodium chloride 100 mL/hr at 06/08/14 1900    Time Spent: 35 min   Charlynne Cousins  [facility] Pager [phone]. If 7PM-7AM, please contact night-coverage at [email or web address], password [facility] 06/09/2014, 8:47 AM  LOS: 1 day

## 2014-06-10 LAB — BASIC METABOLIC PANEL
Anion gap: 6 (ref 5–15)
BUN: 5 mg/dL — AB (ref 6–20)
CALCIUM: 7.7 mg/dL — AB (ref 8.9–10.3)
CO2: 23 mmol/L (ref 22–32)
CREATININE: 0.55 mg/dL (ref 0.44–1.00)
Chloride: 100 mmol/L — ABNORMAL LOW (ref 101–111)
GFR calc non Af Amer: >60 mL/min (ref >60)
GLUCOSE: 123 mg/dL — AB (ref 70–99)
Potassium: 3.5 mmol/L (ref 3.5–5.1)
Sodium: 129 mmol/L — ABNORMAL LOW (ref 135–145)

## 2014-06-10 MED ORDER — POTASSIUM CHLORIDE CRYS ER 20 MEQ PO TBCR
40.0000 meq | EXTENDED_RELEASE_TABLET | Freq: Two times a day (BID) | ORAL | Status: AC
Start: 2014-06-10 — End: 2014-06-10
  Administered 2014-06-10 (×2): 40 meq via ORAL
  Filled 2014-06-10 (×2): qty 2

## 2014-06-10 MED ORDER — TAPENTADOL HCL 50 MG PO TABS: 50.0000 mg | ORAL_TABLET | Freq: Three times a day (TID) | ORAL | Status: DC | PRN

## 2014-06-10 MED ORDER — TAPENTADOL HCL 50 MG PO TABS: 100.0000 mg | ORAL_TABLET | Freq: Three times a day (TID) | ORAL | Status: DC | PRN

## 2014-06-10 MED ORDER — OXYCODONE HCL 5 MG PO TABS
5.0000 mg | ORAL_TABLET | Freq: Four times a day (QID) | ORAL | Status: DC | PRN
Start: 2014-06-10 — End: 2014-06-11
  Administered 2014-06-10 – 2014-06-11 (×3): 5 mg via ORAL
  Filled 2014-06-10 (×3): qty 1

## 2014-06-10 NOTE — Progress Notes (Signed)
Utilization review Completed Keana Dueitt RN BSN   

## 2014-06-10 NOTE — Progress Notes (Signed)
[facility] PROGRESS NOTE  Assessment/Plan: Hyponatremia - She is on 2 diuretics at home, which might make her intravascularly depleted. - Her sodium continues to improve, I will continue IV fluids, monitor strict I's and O's. Check a basic metabolic panel in the morning.   Acute encephalopathy: - Likely due  medication induced, she is on oxycodone, Robaxin, Phenergan and benzodiazepines.  - DC narcotics change benzodiazepines 4 hours to every 12 hours. - Response to pain because back to sleep.     Hypokalemia: - Improving continue oral hydration, showed a basic metabolic panel in the morning.   Essential Hypertension - She is on antihypertensive medication and blood pressure is at goal.  Unsteady gait - CT is negative. - Her unsteadiness is unlikely a stroke, is most likely due to her hyponatremia. - PT consult pending, she relates that her gait is much better than when she came in.   Code Status: full Family Communication: none  Disposition Plan: home in 24 hrs   Consultants:  none  Procedures:  CT head  Antibiotics:  None  HPI/Subjective: She relates she feels much better light sleepy, she was able to walk to the bathroom and her gait was improved.  Objective: Filed Vitals:   06/09/14 1035 06/09/14 1524 06/09/14 2129 06/10/14 0424  BP: 119/73 112/65 119/63 121/66  Pulse: 66 68 72 68  Temp: 98 F (36.7 C) 97.8 F (36.6 C) 98.5 F (36.9 C) 98.5 F (36.9 C)  TempSrc: Oral Oral Oral Oral  Resp: 20 20 20 20   Height:      Weight:      SpO2: 100% 100% 98% 100%    Intake/Output Summary (Last 24 hours) at 06/10/14 0759 Last data filed at 06/10/14 0600  Gross per 24 hour  Intake 3475.84 ml  Output   1200 ml  Net 2275.84 ml   Filed Weights   06/08/14 1413 06/08/14 1850  Weight: 124.739 kg (275 lb) 132.586 kg (292 lb 4.8 oz)    Exam:  General: in no acute distress.  oversedated  HEENT: No bruits, no goiter.  Heart: Regular rate and  rhythm. Lungs: Good air movement, clear Abdomen: Soft, nontender, nondistended, positive bowel sounds.  Neuro: Grossly intact, nonfocal.   Data Reviewed: Basic Metabolic Panel:  Recent Labs Lab 06/08/14 1414 06/08/14 1433 06/09/14 0558 06/10/14 0452  NA 120* 120* 123* 129*  K 3.0* 2.9* 3.1* 3.5  CL 86* 86* 91* 100*  CO2 25  --  25 23  GLUCOSE 109* 111* 102* 123*  BUN 10 9 8  5*  CREATININE 0.62 0.60 0.60 0.55  CALCIUM 8.6  --  8.1* 7.7*   Liver Function Tests:  Recent Labs Lab 06/08/14 1414 06/09/14 0558  AST 17 12  ALT 14 12  ALKPHOS 70 60  BILITOT 0.4 0.2*  PROT 7.1 6.0  ALBUMIN 3.8 3.1*   No results for input(s): LIPASE, AMYLASE in the last 168 hours. No results for input(s): AMMONIA in the last 168 hours. CBC:  Recent Labs Lab 06/08/14 1414 06/08/14 1433 06/09/14 0558  WBC 8.2  --  7.7  NEUTROABS 6.2  --   --   HGB 10.3* 11.9* 9.4*  HCT 31.7* 35.0* 28.2*  MCV 71.6*  --  71.8*  PLT 343  --  302   Cardiac Enzymes: No results for input(s): CKTOTAL, CKMB, CKMBINDEX, TROPONINI in the last 168 hours. BNP (last 3 results) No results for input(s): BNP in the last 8760 hours.  ProBNP (last 3 results)  No results for input(s): PROBNP in the last 8760 hours.  CBG: No results for input(s): GLUCAP in the last 168 hours.  No results found for this or any previous visit (from the past 240 hour(s)).   Studies: Ct Head Wo Contrast  06/08/2014   CLINICAL DATA:  Dizziness, confusion, slurred speech, and facial droop for 4 days. Code stroke.  EXAM: CT HEAD WITHOUT CONTRAST  TECHNIQUE: Contiguous axial images were obtained from the base of the skull through the vertex without intravenous contrast.  COMPARISON:  02/19/2013  FINDINGS: The ventricles and sulci are within normal limits for age. There is no evidence of acute infarct, intracranial hemorrhage, mass, midline shift, or extra-axial collection.  The orbits are unremarkable. Mild left maxillary sinus mucosal  thickening is partially visualized. Mastoid air cells are clear. There is no evidence of acute fracture.  IMPRESSION: Unremarkable CT appearance of the brain.  These results were called by telephone at the time of interpretation on 06/08/2014 at 2:36 pm to Dr. Fredia Sorrow , who verbally acknowledged these results.   Electronically Signed   By: Logan Bores   On: 06/08/2014 14:38    Scheduled Meds: . DULoxetine  60 mg Oral Daily  . heparin  5,000 Units Subcutaneous 3 times per day  . loratadine  10 mg Oral Daily  . Oxcarbazepine  1,200 mg Oral BID  . pantoprazole  80 mg Oral Daily  . pregabalin  200 mg Oral BID  . simvastatin  40 mg Oral QHS  . topiramate  100 mg Oral BID   Continuous Infusions: . sodium chloride 125 mL/hr at 06/09/14 2336    Time Spent: 35 min   Charlynne Cousins  [facility] Pager [phone]. If 7PM-7AM, please contact night-coverage at [email or web address], password [facility] 06/10/2014, 7:59 AM  LOS: 2 days

## 2014-06-11 LAB — BASIC METABOLIC PANEL
ANION GAP: 5 (ref 5–15)
BUN: 6 mg/dL (ref 6–20)
CO2: 20 mmol/L — ABNORMAL LOW (ref 22–32)
CREATININE: 0.52 mg/dL (ref 0.44–1.00)
Calcium: 7.9 mg/dL — ABNORMAL LOW (ref 8.9–10.3)
Chloride: 106 mmol/L (ref 101–111)
Glucose, Bld: 124 mg/dL — ABNORMAL HIGH (ref 70–99)
POTASSIUM: 3.7 mmol/L (ref 3.5–5.1)
SODIUM: 131 mmol/L — AB (ref 135–145)

## 2014-06-11 LAB — RETICULOCYTES
RBC.: 3.77 MIL/uL — ABNORMAL LOW (ref 3.87–5.11)
Retic Count, Absolute: 45.2 10*3/uL (ref 19.0–186.0)
Retic Ct Pct: 1.2 % (ref 0.4–3.1)

## 2014-06-11 LAB — FERRITIN: Ferritin: 4 ng/mL — ABNORMAL LOW (ref 11–307)

## 2014-06-11 LAB — FOLATE: Folate: 6.7 ng/mL (ref >5.9)

## 2014-06-11 LAB — IRON AND TIBC
IRON: 13 ug/dL — AB (ref 28–170)
Saturation Ratios: 3 % — ABNORMAL LOW (ref 10.4–31.8)
TIBC: 430 ug/dL (ref 250–450)
UIBC: 417 ug/dL

## 2014-06-11 LAB — VITAMIN B12: Vitamin B-12: 229 pg/mL (ref 180–914)

## 2014-06-11 MED ORDER — FERROUS SULFATE 325 (65 FE) MG PO TABS
325.0000 mg | ORAL_TABLET | Freq: Three times a day (TID) | ORAL | Status: DC
  Administered 2014-06-11 – 2014-06-12 (×3): 325 mg via ORAL
  Filled 2014-06-11 (×4): qty 1

## 2014-06-11 MED ORDER — POLYETHYLENE GLYCOL 3350 17 G PO PACK
17.0000 g | PACK | Freq: Every day | ORAL | Status: DC
  Administered 2014-06-11 – 2014-06-12 (×2): 17 g via ORAL
  Filled 2014-06-11 (×2): qty 1

## 2014-06-11 MED ORDER — OXYCODONE HCL 5 MG PO TABS: 10.0000 mg | ORAL_TABLET | Freq: Four times a day (QID) | ORAL | Status: DC | PRN

## 2014-06-11 MED ORDER — OXYCODONE HCL 5 MG PO TABS
5.0000 mg | ORAL_TABLET | Freq: Four times a day (QID) | ORAL | Status: DC | PRN
  Administered 2014-06-11 – 2014-06-12 (×2): 5 mg via ORAL
  Filled 2014-06-11 (×2): qty 1

## 2014-06-11 MED ORDER — CYANOCOBALAMIN 1000 MCG/ML IJ SOLN
1000.0000 ug | Freq: Once | INTRAMUSCULAR | Status: AC
  Administered 2014-06-11: 1000 ug via INTRAMUSCULAR
  Filled 2014-06-11: qty 1

## 2014-06-11 NOTE — Progress Notes (Signed)
[facility] PROGRESS NOTE  Assessment/Plan: Hyponatremia: - She is on 2 diuretics at home, which might make her intravascularly depleted. - Her sodium continues to improve. - Monitor strict I's and O's.  - There is a moderate drop in her bicarbonate, I will KVO IV fluids check a basic metabolic panel in the morning.   Acute encephalopathy: - Likely due  medication induced and hyponatremia, she is on oxycodone, Robaxin, Phenergan and benzodiazepines.  - D/C narcotics change benzodiazepines 4 hours to every 12 hours. - Response to pain because back to sleep.   Hypokalemia: - Improving continue oral hydration, showed a basic metabolic panel in the morning.   Essential Hypertension - She is on antihypertensive medication and blood pressure is at goal.  Unsteady gait - CT is negative. - Her unsteadiness is unlikely a stroke, is most likely due to her hyponatremia and narcotics. - PT consult pending, she relates that her gait is much better than when she came in.  Microcytic anemia: - We'll check an anemia panel. - She is a menstruating female is probably because of her iron deficiency anemia. Her ferritin is pending. Will start on ferrous sulfate 3 times a day.  Code Status: full Family Communication: none  Disposition Plan: home in 24 hrs   Consultants:  none  Procedures:  CT head  Antibiotics:  None  HPI/Subjective: She relates she feels much better, no complains  Objective: Filed Vitals:   06/10/14 1502 06/10/14 1603 06/10/14 2158 06/11/14 0611  BP: 84/50 110/50 114/63 113/63  Pulse: 77  81 66  Temp: 98 F (36.7 C)  98.3 F (36.8 C) 97.7 F (36.5 C)  TempSrc: Oral  Oral Oral  Resp: 19  20 20   Height:      Weight:      SpO2: 100%  100% 100%    Intake/Output Summary (Last 24 hours) at 06/11/14 0903 Last data filed at 06/11/14 0600  Gross per 24 hour  Intake   5040 ml  Output   1100 ml  Net   3940 ml   Filed Weights   06/08/14 1413  06/08/14 1850  Weight: 124.739 kg (275 lb) 132.586 kg (292 lb 4.8 oz)    Exam:  General: in no acute distress.  oversedated  HEENT: No bruits, no goiter.  Heart: Regular rate and rhythm. Lungs: Good air movement, clear Abdomen: Soft, nontender, nondistended, positive bowel sounds.  Neuro: Grossly intact, nonfocal.   Data Reviewed: Basic Metabolic Panel:  Recent Labs Lab 06/08/14 1414 06/08/14 1433 06/09/14 0558 06/10/14 0452 06/11/14 0601  NA 120* 120* 123* 129* 131*  K 3.0* 2.9* 3.1* 3.5 3.7  CL 86* 86* 91* 100* 106  CO2 25  --  25 23 20*  GLUCOSE 109* 111* 102* 123* 124*  BUN 10 9 8  5* 6  CREATININE 0.62 0.60 0.60 0.55 0.52  CALCIUM 8.6  --  8.1* 7.7* 7.9*   Liver Function Tests:  Recent Labs Lab 06/08/14 1414 06/09/14 0558  AST 17 12  ALT 14 12  ALKPHOS 70 60  BILITOT 0.4 0.2*  PROT 7.1 6.0  ALBUMIN 3.8 3.1*   No results for input(s): LIPASE, AMYLASE in the last 168 hours. No results for input(s): AMMONIA in the last 168 hours. CBC:  Recent Labs Lab 06/08/14 1414 06/08/14 1433 06/09/14 0558  WBC 8.2  --  7.7  NEUTROABS 6.2  --   --   HGB 10.3* 11.9* 9.4*  HCT 31.7* 35.0* 28.2*  MCV 71.6*  --  71.8*  PLT 343  --  302   Cardiac Enzymes: No results for input(s): CKTOTAL, CKMB, CKMBINDEX, TROPONINI in the last 168 hours. BNP (last 3 results) No results for input(s): BNP in the last 8760 hours.  ProBNP (last 3 results) No results for input(s): PROBNP in the last 8760 hours.  CBG: No results for input(s): GLUCAP in the last 168 hours.  No results found for this or any previous visit (from the past 240 hour(s)).   Studies: No results found.  Scheduled Meds: . DULoxetine  60 mg Oral Daily  . heparin  5,000 Units Subcutaneous 3 times per day  . loratadine  10 mg Oral Daily  . Oxcarbazepine  1,200 mg Oral BID  . pantoprazole  80 mg Oral Daily  . pregabalin  200 mg Oral BID  . simvastatin  40 mg Oral QHS  . topiramate  100 mg Oral BID    Continuous Infusions: . sodium chloride 125 mL/hr at 06/11/14 0840    Time Spent: 35 min   Charlynne Cousins  [facility] Pager [phone]. If 7PM-7AM, please contact night-coverage at [email or web address], password [facility] 06/11/2014, 9:03 AM  LOS: 3 days

## 2014-06-11 NOTE — Evaluation (Signed)
Physical Therapy Evaluation Patient Details Name: Christine Jenkins MRN: [id number] DOB: August 28, 1970 Today's Date: 06/11/2014   History of Present Illness  This is a 44 year old lady that was sent to the emergency room by neurology, Dr. Merlene Laughter. She was noted to have unsteady gait with possible facial droop. She was noted to be hyponatremic when lab work were done. She tells me that her antihypertensive medication has been changed to lisinopril with thiazide diuretic. Previously she was on lisinopril and furosemide only. She has had nausea and has had poor by mouth intake. She denies any chest pain, palpitations, dyspnea, fever. She is now being admitted for further management.  Clinical Impression   Pt is seen for evaluation and found to be at functional baseline.  Her gait is stable and she states that she is feeling much better.    Follow Up Recommendations No PT follow up    Equipment Recommendations  None recommended by PT    Recommendations for Other Services   none    Precautions / Restrictions Precautions Precautions: None Restrictions Weight Bearing Restrictions: No      Mobility  Bed Mobility Overal bed mobility: Independent                Transfers Overall transfer level: Independent                  Ambulation/Gait Ambulation/Gait assistance: Independent Ambulation Distance (Feet): 200 Feet Assistive device: None Gait Pattern/deviations: WFL(Within Functional Limits)   Gait velocity interpretation: at or above normal speed for age/gender    Stairs            Wheelchair Mobility    Modified Rankin (Stroke Patients Only)       Balance Overall balance assessment: No apparent balance deficits (not formally assessed)                                           Pertinent Vitals/Pain Pain Assessment: 0-10 Pain Score: 8  Pain Location: migraine Pain Descriptors / Indicators: Headache Pain Intervention(s): Patient  requesting pain meds-RN notified    Home Living Family/patient expects to be discharged to:: Private residence Living Arrangements: Children;Other relatives;Spouse/significant other Available Help at Discharge: Family Type of Home: House Home Access: Ramped entrance     Home Layout: One level Home Equipment: None      Prior Function Level of Independence: Independent               Hand Dominance        Extremity/Trunk Assessment   Upper Extremity Assessment: Overall WFL for tasks assessed           Lower Extremity Assessment: Overall WFL for tasks assessed      Cervical / Trunk Assessment: Normal  Communication   Communication: No difficulties  Cognition Arousal/Alertness: Awake/alert Behavior During Therapy: WFL for tasks assessed/performed Overall Cognitive Status: Within Functional Limits for tasks assessed                      General Comments      Exercises        Assessment/Plan    PT Assessment Patent does not need any further PT services  PT Diagnosis     PT Problem List    PT Treatment Interventions     PT Goals (Current goals can be found in the Care Plan section) Acute  Rehab PT Goals PT Goal Formulation: All assessment and education complete, DC therapy    Frequency     Barriers to discharge        Co-evaluation               End of Session Equipment Utilized During Treatment: Gait belt Activity Tolerance: Patient tolerated treatment well Patient left: in bed;with call bell/phone within reach Nurse Communication: Mobility status         Time: 6811-5726 PT Time Calculation (min) (ACUTE ONLY): 24 min   Charges:   PT Evaluation $Initial PT Evaluation Tier I: 1 Procedure     PT G CodesDemetrios Isaacs L 06/11/2014, 11:49 AM

## 2014-06-11 NOTE — Care Management Note (Signed)
Case Management Note  Patient Details  Name: Christine Jenkins MRN: [id number] Date of Birth: Jul 18, 1970  Subjective/Objective:                    Action/Plan:  Pt is from home, lives with family. Pt has no HH or DME's prior to admission. Pt has no CM needs.   Expected Discharge Date:  06/12/14               Expected Discharge Plan:  Home/Self Care  In-House Referral:  NA  Discharge planning Services  CM Consult  Post Acute Care Choice:    Choice offered to:     DME Arranged:    DME Agency:     HH Arranged:    HH Agency:     Status of Service:  Completed, signed off  Medicare Important Message Given:    Date Medicare IM Given:    Medicare IM give by:    Date Additional Medicare IM Given:    Additional Medicare Important Message give by:     If discussed at [place] of Stay Meetings, dates discussed:    Additional Comments:  Sherald Barge, RN 06/11/2014, 9:23 AM
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
## 2014-06-12 DIAGNOSIS — G9340 Encephalopathy, unspecified: Secondary | ICD-10-CM

## 2014-06-12 LAB — BASIC METABOLIC PANEL
Anion gap: 4 — ABNORMAL LOW (ref 5–15)
BUN: 7 mg/dL (ref 6–20)
CO2: 24 mmol/L (ref 22–32)
Calcium: 8.2 mg/dL — ABNORMAL LOW (ref 8.9–10.3)
Chloride: 103 mmol/L (ref 101–111)
Creatinine, Ser: 0.61 mg/dL (ref 0.44–1.00)
GFR calc Af Amer: >60 mL/min (ref >60)
GFR calc non Af Amer: >60 mL/min (ref >60)
GLUCOSE: 106 mg/dL — AB (ref 70–99)
POTASSIUM: 3.6 mmol/L (ref 3.5–5.1)
Sodium: 131 mmol/L — ABNORMAL LOW (ref 135–145)

## 2014-06-12 MED ORDER — FERROUS SULFATE 325 (65 FE) MG PO TABS: 325.0000 mg | ORAL_TABLET | Freq: Three times a day (TID) | ORAL | Status: DC

## 2014-06-12 NOTE — Discharge Summary (Signed)
Physician Discharge Summary  Christine Jenkins CBJ:[id number] DOB: 1970/06/08 DOA: 06/08/2014  PCP: Deloria Lair, MD  Admit date: 06/08/2014 Discharge date: 06/12/2014  Time spent: >35 minutes  Recommendations for Outpatient Follow-up:  PCP in 3-5 days to repeat labs.  Neurology as outpatient in 2-3 weeks as needed  Discharge Diagnoses:  Active Problems:   Hyponatremia   Hypokalemia   Hypertension   Unsteady gait   Discharge Condition: stable   Diet recommendation: regular   Filed Weights   06/08/14 1413 06/08/14 1850  Weight: 124.739 kg (275 lb) 132.586 kg (292 lb 4.8 oz)    History of present illness:  44 y/o lady with PMH of Anxiety, Chronic pain, Migraines, h/o HTN that was sent to the emergency room by neurology, Dr. Merlene Laughter. She was noted to have unsteady gait with possible facial droop. She was noted to be hyponatremic  Hospital Course:  1. Hyponatremia likely due to diuretics Lasix+and recent medication addition of HCTZ.  -Sodium is improving with holding hctz/lasix. Will discontinue lasix/hctz at the discharge. Patient is evolemic. no indications for diuretics at this time.  Recommended to f/u with PCP in 3-5 days to repeat labs, to reevaluate for diuretics as needed  -if persistent hyponatremia despite holding diuretics. May consider stopping SSRI   2. Acute metabolic encephalopathy with unstable gait in the setting of hyponatremia and polypharmacy (benzo+opioids) -encephalopathy,unstable gait --resolved. Neuro exam is non focal. CT head: no acute findings. Recommended to stop ativan (patient was using prn, not on chronic bases) and decrease opioids.  3. H/o HTN. BP is stable off meds. Cont holding diuretics/ ACE/hctz. Recommended to f/u with PCP in 3-5 days to reevaluate for medication treatment as needed  4. Iron deficiency anemia. No s/s of acute bleeding. Started on iron supplementation. Recommended outpatient follow up for screening. GI eval for endoscopy     Procedures:  none (i.e. Studies not automatically included, echos, thoracentesis, etc; not x-rays)  Consultations:  Neurology   Discharge Exam: Filed Vitals:   06/12/14 0648  BP: 102/46  Pulse: 65  Temp: 98.3 F (36.8 C)  Resp: 20    General: alert, oriented. No distress.  Cardiovascular: s1,s2 rrr Respiratory: CTA VBL  Discharge Instructions  Discharge Instructions    Diet - low sodium heart healthy    Complete by:  As directed      Discharge instructions    Complete by:  As directed   Please follow up with primary care doctor in 3-5 days     Increase activity slowly    Complete by:  As directed             Medication List    STOP taking these medications        furosemide 20 MG tablet  Commonly known as:  LASIX     lisinopril-hydrochlorothiazide 20-12.5 MG per tablet  Commonly known as:  PRINZIDE,ZESTORETIC     LORazepam 0.5 MG tablet  Commonly known as:  ATIVAN     methocarbamol 750 MG tablet  Commonly known as:  ROBAXIN     NUCYNTA 100 MG Tabs  Generic drug:  Tapentadol HCl     promethazine 25 MG tablet  Commonly known as:  PHENERGAN      TAKE these medications        cetirizine 10 MG tablet  Commonly known as:  ZYRTEC  Take 10 mg by mouth daily.     DULoxetine 60 MG capsule  Commonly known as:  CYMBALTA  Take 60 mg  by mouth daily.     esomeprazole 40 MG capsule  Commonly known as:  NEXIUM  Take 40 mg by mouth daily.     ferrous sulfate 325 (65 FE) MG tablet  Take 1 tablet (325 mg total) by mouth 3 (three) times daily with meals.     oxcarbazepine 600 MG tablet  Commonly known as:  TRILEPTAL  Take 1,200 mg by mouth 2 (two) times daily.     pregabalin 200 MG capsule  Commonly known as:  LYRICA  Take 200 mg by mouth 2 (two) times daily.     simvastatin 40 MG tablet  Commonly known as:  ZOCOR  Take 40 mg by mouth at bedtime.     SUMAtriptan Succinate 6 MG/0.5ML Soct  Inject 0.5 mLs into the skin every 2 (two) hours as  needed (for migraine relief-not to exceed 2 doses within 24 hours).     topiramate 100 MG tablet  Commonly known as:  TOPAMAX  Take 100 mg by mouth 2 (two) times daily.     zolpidem 5 MG tablet  Commonly known as:  AMBIEN  Take 5 mg by mouth at bedtime as needed for sleep.       Allergies  Allergen Reactions  . Morphine And Related Shortness Of Breath, Swelling and Other (See Comments)    Causes bleeding, throat swelling Pt states she has tolerated Oxycodone       Follow-up Information    Follow up with TAPPER,DAVID B, MD. Schedule an appointment as soon as possible for a visit in 3 days.   Specialty:  Family Medicine   Contact information:   [place] [id number] [phone]        The results of significant diagnostics from this hospitalization (including imaging, microbiology, ancillary and laboratory) are listed below for reference.    Significant Diagnostic Studies: Ct Head Wo Contrast  06/08/2014   CLINICAL DATA:  Dizziness, confusion, slurred speech, and facial droop for 4 days. Code stroke.  EXAM: CT HEAD WITHOUT CONTRAST  TECHNIQUE: Contiguous axial images were obtained from the base of the skull through the vertex without intravenous contrast.  COMPARISON:  02/19/2013  FINDINGS: The ventricles and sulci are within normal limits for age. There is no evidence of acute infarct, intracranial hemorrhage, mass, midline shift, or extra-axial collection.  The orbits are unremarkable. Mild left maxillary sinus mucosal thickening is partially visualized. Mastoid air cells are clear. There is no evidence of acute fracture.  IMPRESSION: Unremarkable CT appearance of the brain.  These results were called by telephone at the time of interpretation on 06/08/2014 at 2:36 pm to Dr. Fredia Sorrow , who verbally acknowledged these results.   Electronically Signed   By: Logan Bores   On: 06/08/2014 14:38    Microbiology: No results found for this or any previous  visit (from the past 240 hour(s)).   Labs: Basic Metabolic Panel:  Recent Labs Lab 06/08/14 1414 06/08/14 1433 06/09/14 0558 06/10/14 0452 06/11/14 0601 06/12/14 0736  NA 120* 120* 123* 129* 131* 131*  K 3.0* 2.9* 3.1* 3.5 3.7 3.6  CL 86* 86* 91* 100* 106 103  CO2 25  --  25 23 20* 24  GLUCOSE 109* 111* 102* 123* 124* 106*  BUN 10 9 8  5* 6 7  CREATININE 0.62 0.60 0.60 0.55 0.52 0.61  CALCIUM 8.6  --  8.1* 7.7* 7.9* 8.2*   Liver Function Tests:  Recent Labs Lab 06/08/14 1414 06/09/14 0558  AST 17 12  ALT 14 12  ALKPHOS 70 60  BILITOT 0.4 0.2*  PROT 7.1 6.0  ALBUMIN 3.8 3.1*   No results for input(s): LIPASE, AMYLASE in the last 168 hours. No results for input(s): AMMONIA in the last 168 hours. CBC:  Recent Labs Lab 06/08/14 1414 06/08/14 1433 06/09/14 0558  WBC 8.2  --  7.7  NEUTROABS 6.2  --   --   HGB 10.3* 11.9* 9.4*  HCT 31.7* 35.0* 28.2*  MCV 71.6*  --  71.8*  PLT 343  --  302   Cardiac Enzymes: No results for input(s): CKTOTAL, CKMB, CKMBINDEX, TROPONINI in the last 168 hours. BNP: BNP (last 3 results) No results for input(s): BNP in the last 8760 hours.  ProBNP (last 3 results) No results for input(s): PROBNP in the last 8760 hours.  CBG: No results for input(s): GLUCAP in the last 168 hours.     SignedKinnie Feil  [facility] 06/12/2014, 10:00 AM

## 2014-06-12 NOTE — Care Management Note (Signed)
Case Management Note  Patient Details  Name: Christine Jenkins MRN: [id number] Date of Birth: Aug 27, 1970  Subjective/Objective:                    Action/Plan:   Expected Discharge Date:  06/12/14               Expected Discharge Plan:  Home/Self Care  In-House Referral:  NA  Discharge planning Services  CM Consult  Post Acute Care Choice:    Choice offered to:     DME Arranged:    DME Agency:     HH Arranged:    [place] Agency:     Status of Service:  Completed, signed off  Medicare Important Message Given:  Yes Date Medicare IM Given:  06/12/14 Medicare IM give by:  Jolene Provost, RN, MSN, CM  Date Additional Medicare IM Given:    Additional Medicare Important Message give by:     If discussed at [place] of Stay Meetings, dates discussed:    Additional Comments: Pt discharging home today, No CM needs.   Sherald Barge, RN 06/12/2014, 11:04 AM

## 2014-06-12 NOTE — Progress Notes (Signed)
Discharge instructions given, verbalized understanding, out in stable condition via w/c with staff. 

## 2014-06-13 LAB — HIV ANTIBODY (ROUTINE TESTING W REFLEX): HIV Screen 4th Generation wRfx: NONREACTIVE

## 2014-06-13 LAB — RPR: RPR Ser Ql: NONREACTIVE

## 2014-06-15 LAB — HOMOCYSTEINE: Homocysteine: 9.2 umol/L (ref 0.0–15.0)

## 2014-06-19 DIAGNOSIS — R2689 Other abnormalities of gait and mobility: Secondary | ICD-10-CM | POA: Diagnosis not present

## 2014-06-19 DIAGNOSIS — Z6841 Body Mass Index (BMI) 40.0 and over, adult: Secondary | ICD-10-CM | POA: Diagnosis not present

## 2014-06-19 DIAGNOSIS — D500 Iron deficiency anemia secondary to blood loss (chronic): Secondary | ICD-10-CM | POA: Diagnosis not present

## 2014-06-19 DIAGNOSIS — I10 Essential (primary) hypertension: Secondary | ICD-10-CM | POA: Diagnosis not present

## 2014-06-19 DIAGNOSIS — E871 Hypo-osmolality and hyponatremia: Secondary | ICD-10-CM | POA: Diagnosis not present

## 2014-06-22 DIAGNOSIS — G4459 Other complicated headache syndrome: Secondary | ICD-10-CM | POA: Diagnosis not present

## 2014-06-22 DIAGNOSIS — G500 Trigeminal neuralgia: Secondary | ICD-10-CM | POA: Diagnosis not present

## 2014-06-27 DIAGNOSIS — R2681 Unsteadiness on feet: Secondary | ICD-10-CM | POA: Diagnosis not present

## 2014-06-27 DIAGNOSIS — G894 Chronic pain syndrome: Secondary | ICD-10-CM | POA: Diagnosis not present

## 2014-06-27 DIAGNOSIS — Z79899 Other long term (current) drug therapy: Secondary | ICD-10-CM | POA: Diagnosis not present

## 2014-06-27 DIAGNOSIS — G500 Trigeminal neuralgia: Secondary | ICD-10-CM | POA: Diagnosis not present

## 2014-06-27 DIAGNOSIS — G4459 Other complicated headache syndrome: Secondary | ICD-10-CM | POA: Diagnosis not present

## 2014-06-27 DIAGNOSIS — I10 Essential (primary) hypertension: Secondary | ICD-10-CM | POA: Diagnosis not present

## 2014-06-28 ENCOUNTER — Other Ambulatory Visit: Payer: Self-pay | Admitting: Neurology

## 2014-06-28 DIAGNOSIS — R269 Unspecified abnormalities of gait and mobility: Secondary | ICD-10-CM

## 2014-06-29 ENCOUNTER — Inpatient Hospital Stay: Admission: RE | Admit: 2014-06-29 | Payer: Medicare Other | Source: Ambulatory Visit

## 2014-07-12 DIAGNOSIS — K219 Gastro-esophageal reflux disease without esophagitis: Secondary | ICD-10-CM | POA: Diagnosis not present

## 2014-07-12 DIAGNOSIS — D500 Iron deficiency anemia secondary to blood loss (chronic): Secondary | ICD-10-CM | POA: Diagnosis not present

## 2014-07-12 DIAGNOSIS — I10 Essential (primary) hypertension: Secondary | ICD-10-CM | POA: Diagnosis not present

## 2014-07-12 DIAGNOSIS — G4733 Obstructive sleep apnea (adult) (pediatric): Secondary | ICD-10-CM | POA: Diagnosis not present

## 2014-07-12 DIAGNOSIS — F418 Other specified anxiety disorders: Secondary | ICD-10-CM | POA: Diagnosis not present

## 2014-07-12 DIAGNOSIS — S80212A Abrasion, left knee, initial encounter: Secondary | ICD-10-CM | POA: Diagnosis not present

## 2014-07-12 DIAGNOSIS — E780 Pure hypercholesterolemia: Secondary | ICD-10-CM | POA: Diagnosis not present

## 2014-07-12 DIAGNOSIS — Z6841 Body Mass Index (BMI) 40.0 and over, adult: Secondary | ICD-10-CM | POA: Diagnosis not present

## 2014-07-12 DIAGNOSIS — Z0000 Encounter for general adult medical examination without abnormal findings: Secondary | ICD-10-CM | POA: Diagnosis not present

## 2014-07-26 DIAGNOSIS — G4459 Other complicated headache syndrome: Secondary | ICD-10-CM | POA: Diagnosis not present

## 2014-07-26 DIAGNOSIS — I10 Essential (primary) hypertension: Secondary | ICD-10-CM | POA: Diagnosis not present

## 2014-07-26 DIAGNOSIS — R2681 Unsteadiness on feet: Secondary | ICD-10-CM | POA: Diagnosis not present

## 2014-07-26 DIAGNOSIS — G500 Trigeminal neuralgia: Secondary | ICD-10-CM | POA: Diagnosis not present

## 2014-09-05 DIAGNOSIS — G894 Chronic pain syndrome: Secondary | ICD-10-CM | POA: Diagnosis not present

## 2014-09-05 DIAGNOSIS — Z79899 Other long term (current) drug therapy: Secondary | ICD-10-CM | POA: Diagnosis not present

## 2014-09-05 DIAGNOSIS — G4459 Other complicated headache syndrome: Secondary | ICD-10-CM | POA: Diagnosis not present

## 2014-09-05 DIAGNOSIS — G500 Trigeminal neuralgia: Secondary | ICD-10-CM | POA: Diagnosis not present

## 2014-09-05 DIAGNOSIS — I10 Essential (primary) hypertension: Secondary | ICD-10-CM | POA: Diagnosis not present

## 2014-10-02 DIAGNOSIS — R2681 Unsteadiness on feet: Secondary | ICD-10-CM | POA: Diagnosis not present

## 2014-10-02 DIAGNOSIS — G500 Trigeminal neuralgia: Secondary | ICD-10-CM | POA: Diagnosis not present

## 2014-10-02 DIAGNOSIS — G4459 Other complicated headache syndrome: Secondary | ICD-10-CM | POA: Diagnosis not present

## 2014-10-02 DIAGNOSIS — I10 Essential (primary) hypertension: Secondary | ICD-10-CM | POA: Diagnosis not present

## 2014-10-12 DIAGNOSIS — G4459 Other complicated headache syndrome: Secondary | ICD-10-CM | POA: Diagnosis not present

## 2014-10-12 DIAGNOSIS — G500 Trigeminal neuralgia: Secondary | ICD-10-CM | POA: Diagnosis not present

## 2014-10-31 DIAGNOSIS — I10 Essential (primary) hypertension: Secondary | ICD-10-CM | POA: Diagnosis not present

## 2014-10-31 DIAGNOSIS — Z79899 Other long term (current) drug therapy: Secondary | ICD-10-CM | POA: Diagnosis not present

## 2014-10-31 DIAGNOSIS — G4459 Other complicated headache syndrome: Secondary | ICD-10-CM | POA: Diagnosis not present

## 2014-10-31 DIAGNOSIS — M545 Low back pain: Secondary | ICD-10-CM | POA: Diagnosis not present

## 2014-10-31 DIAGNOSIS — G500 Trigeminal neuralgia: Secondary | ICD-10-CM | POA: Diagnosis not present

## 2014-12-27 DIAGNOSIS — Z79899 Other long term (current) drug therapy: Secondary | ICD-10-CM | POA: Diagnosis not present

## 2014-12-27 DIAGNOSIS — R2681 Unsteadiness on feet: Secondary | ICD-10-CM | POA: Diagnosis not present

## 2014-12-27 DIAGNOSIS — R42 Dizziness and giddiness: Secondary | ICD-10-CM | POA: Diagnosis not present

## 2014-12-27 DIAGNOSIS — G4459 Other complicated headache syndrome: Secondary | ICD-10-CM | POA: Diagnosis not present

## 2014-12-27 DIAGNOSIS — G500 Trigeminal neuralgia: Secondary | ICD-10-CM | POA: Diagnosis not present

## 2014-12-27 DIAGNOSIS — I10 Essential (primary) hypertension: Secondary | ICD-10-CM | POA: Diagnosis not present

## 2015-01-28 DIAGNOSIS — I252 Old myocardial infarction: Secondary | ICD-10-CM | POA: Diagnosis not present

## 2015-01-28 DIAGNOSIS — F329 Major depressive disorder, single episode, unspecified: Secondary | ICD-10-CM | POA: Diagnosis not present

## 2015-01-28 DIAGNOSIS — F17210 Nicotine dependence, cigarettes, uncomplicated: Secondary | ICD-10-CM | POA: Diagnosis not present

## 2015-01-28 DIAGNOSIS — G43909 Migraine, unspecified, not intractable, without status migrainosus: Secondary | ICD-10-CM | POA: Diagnosis not present

## 2015-01-28 DIAGNOSIS — G501 Atypical facial pain: Secondary | ICD-10-CM | POA: Diagnosis not present

## 2015-01-28 DIAGNOSIS — Z859 Personal history of malignant neoplasm, unspecified: Secondary | ICD-10-CM | POA: Diagnosis not present

## 2015-01-28 DIAGNOSIS — F419 Anxiety disorder, unspecified: Secondary | ICD-10-CM | POA: Diagnosis not present

## 2015-01-28 DIAGNOSIS — R930 Abnormal findings on diagnostic imaging of skull and head, not elsewhere classified: Secondary | ICD-10-CM | POA: Diagnosis not present

## 2015-01-28 DIAGNOSIS — I159 Secondary hypertension, unspecified: Secondary | ICD-10-CM | POA: Diagnosis not present

## 2015-01-28 DIAGNOSIS — Z79899 Other long term (current) drug therapy: Secondary | ICD-10-CM | POA: Diagnosis not present

## 2015-01-28 DIAGNOSIS — G500 Trigeminal neuralgia: Secondary | ICD-10-CM | POA: Diagnosis not present

## 2015-01-28 DIAGNOSIS — I2510 Atherosclerotic heart disease of native coronary artery without angina pectoris: Secondary | ICD-10-CM | POA: Diagnosis not present

## 2015-01-28 DIAGNOSIS — R51 Headache: Secondary | ICD-10-CM | POA: Diagnosis not present

## 2015-01-28 DIAGNOSIS — G4733 Obstructive sleep apnea (adult) (pediatric): Secondary | ICD-10-CM | POA: Diagnosis not present

## 2015-01-28 DIAGNOSIS — Z7982 Long term (current) use of aspirin: Secondary | ICD-10-CM | POA: Diagnosis not present

## 2015-01-28 DIAGNOSIS — R2681 Unsteadiness on feet: Secondary | ICD-10-CM | POA: Diagnosis not present

## 2015-02-21 DIAGNOSIS — Z79899 Other long term (current) drug therapy: Secondary | ICD-10-CM | POA: Diagnosis not present

## 2015-02-21 DIAGNOSIS — G4459 Other complicated headache syndrome: Secondary | ICD-10-CM | POA: Diagnosis not present

## 2015-02-21 DIAGNOSIS — R42 Dizziness and giddiness: Secondary | ICD-10-CM | POA: Diagnosis not present

## 2015-02-21 DIAGNOSIS — R2681 Unsteadiness on feet: Secondary | ICD-10-CM | POA: Diagnosis not present

## 2015-02-21 DIAGNOSIS — G500 Trigeminal neuralgia: Secondary | ICD-10-CM | POA: Diagnosis not present

## 2015-02-21 DIAGNOSIS — I10 Essential (primary) hypertension: Secondary | ICD-10-CM | POA: Diagnosis not present

## 2015-04-18 DIAGNOSIS — J320 Chronic maxillary sinusitis: Secondary | ICD-10-CM | POA: Diagnosis not present

## 2015-04-18 DIAGNOSIS — G4710 Hypersomnia, unspecified: Secondary | ICD-10-CM | POA: Diagnosis not present

## 2015-04-18 DIAGNOSIS — K219 Gastro-esophageal reflux disease without esophagitis: Secondary | ICD-10-CM | POA: Diagnosis not present

## 2015-04-18 DIAGNOSIS — F329 Major depressive disorder, single episode, unspecified: Secondary | ICD-10-CM | POA: Diagnosis not present

## 2015-04-18 DIAGNOSIS — M25519 Pain in unspecified shoulder: Secondary | ICD-10-CM | POA: Diagnosis not present

## 2015-04-18 DIAGNOSIS — I10 Essential (primary) hypertension: Secondary | ICD-10-CM | POA: Diagnosis not present

## 2015-04-18 DIAGNOSIS — G4459 Other complicated headache syndrome: Secondary | ICD-10-CM | POA: Diagnosis not present

## 2015-04-18 DIAGNOSIS — Z79899 Other long term (current) drug therapy: Secondary | ICD-10-CM | POA: Diagnosis not present

## 2015-04-18 DIAGNOSIS — G894 Chronic pain syndrome: Secondary | ICD-10-CM | POA: Diagnosis not present

## 2015-04-18 DIAGNOSIS — G500 Trigeminal neuralgia: Secondary | ICD-10-CM | POA: Diagnosis not present

## 2015-06-18 DIAGNOSIS — R42 Dizziness and giddiness: Secondary | ICD-10-CM | POA: Diagnosis not present

## 2015-06-18 DIAGNOSIS — R2681 Unsteadiness on feet: Secondary | ICD-10-CM | POA: Diagnosis not present

## 2015-06-18 DIAGNOSIS — Z79899 Other long term (current) drug therapy: Secondary | ICD-10-CM | POA: Diagnosis not present

## 2015-06-18 DIAGNOSIS — G894 Chronic pain syndrome: Secondary | ICD-10-CM | POA: Diagnosis not present

## 2015-06-18 DIAGNOSIS — M25519 Pain in unspecified shoulder: Secondary | ICD-10-CM | POA: Diagnosis not present

## 2015-06-18 DIAGNOSIS — I10 Essential (primary) hypertension: Secondary | ICD-10-CM | POA: Diagnosis not present

## 2015-06-18 DIAGNOSIS — G4710 Hypersomnia, unspecified: Secondary | ICD-10-CM | POA: Diagnosis not present

## 2015-06-18 DIAGNOSIS — G500 Trigeminal neuralgia: Secondary | ICD-10-CM | POA: Diagnosis not present

## 2015-06-18 DIAGNOSIS — G4459 Other complicated headache syndrome: Secondary | ICD-10-CM | POA: Diagnosis not present

## 2015-06-18 DIAGNOSIS — J320 Chronic maxillary sinusitis: Secondary | ICD-10-CM | POA: Diagnosis not present

## 2015-06-18 DIAGNOSIS — F329 Major depressive disorder, single episode, unspecified: Secondary | ICD-10-CM | POA: Diagnosis not present

## 2015-06-21 DIAGNOSIS — G4459 Other complicated headache syndrome: Secondary | ICD-10-CM | POA: Diagnosis not present

## 2015-06-21 DIAGNOSIS — G500 Trigeminal neuralgia: Secondary | ICD-10-CM | POA: Diagnosis not present

## 2015-07-29 ENCOUNTER — Ambulatory Visit (INDEPENDENT_AMBULATORY_CARE_PROVIDER_SITE_OTHER): Payer: Medicare Other | Admitting: Otolaryngology

## 2015-08-09 DIAGNOSIS — Z79899 Other long term (current) drug therapy: Secondary | ICD-10-CM | POA: Diagnosis not present

## 2015-08-09 DIAGNOSIS — F329 Major depressive disorder, single episode, unspecified: Secondary | ICD-10-CM | POA: Diagnosis not present

## 2015-08-09 DIAGNOSIS — G894 Chronic pain syndrome: Secondary | ICD-10-CM | POA: Diagnosis not present

## 2015-08-09 DIAGNOSIS — J320 Chronic maxillary sinusitis: Secondary | ICD-10-CM | POA: Diagnosis not present

## 2015-08-09 DIAGNOSIS — K219 Gastro-esophageal reflux disease without esophagitis: Secondary | ICD-10-CM | POA: Diagnosis not present

## 2015-08-09 DIAGNOSIS — R42 Dizziness and giddiness: Secondary | ICD-10-CM | POA: Diagnosis not present

## 2015-08-09 DIAGNOSIS — R2681 Unsteadiness on feet: Secondary | ICD-10-CM | POA: Diagnosis not present

## 2015-08-09 DIAGNOSIS — G4459 Other complicated headache syndrome: Secondary | ICD-10-CM | POA: Diagnosis not present

## 2015-08-09 DIAGNOSIS — G4710 Hypersomnia, unspecified: Secondary | ICD-10-CM | POA: Diagnosis not present

## 2015-08-09 DIAGNOSIS — G500 Trigeminal neuralgia: Secondary | ICD-10-CM | POA: Diagnosis not present

## 2015-08-09 DIAGNOSIS — I10 Essential (primary) hypertension: Secondary | ICD-10-CM | POA: Diagnosis not present

## 2015-08-09 DIAGNOSIS — M25519 Pain in unspecified shoulder: Secondary | ICD-10-CM | POA: Diagnosis not present

## 2015-10-15 DIAGNOSIS — G4459 Other complicated headache syndrome: Secondary | ICD-10-CM | POA: Diagnosis not present

## 2015-10-15 DIAGNOSIS — G500 Trigeminal neuralgia: Secondary | ICD-10-CM | POA: Diagnosis not present

## 2015-10-15 DIAGNOSIS — G894 Chronic pain syndrome: Secondary | ICD-10-CM | POA: Diagnosis not present

## 2015-10-15 DIAGNOSIS — I10 Essential (primary) hypertension: Secondary | ICD-10-CM | POA: Diagnosis not present

## 2015-10-15 DIAGNOSIS — Z79891 Long term (current) use of opiate analgesic: Secondary | ICD-10-CM | POA: Diagnosis not present

## 2015-10-15 DIAGNOSIS — K219 Gastro-esophageal reflux disease without esophagitis: Secondary | ICD-10-CM | POA: Diagnosis not present

## 2015-10-15 DIAGNOSIS — R42 Dizziness and giddiness: Secondary | ICD-10-CM | POA: Diagnosis not present

## 2015-10-15 DIAGNOSIS — G4710 Hypersomnia, unspecified: Secondary | ICD-10-CM | POA: Diagnosis not present

## 2015-10-15 DIAGNOSIS — R2681 Unsteadiness on feet: Secondary | ICD-10-CM | POA: Diagnosis not present

## 2015-10-15 DIAGNOSIS — F329 Major depressive disorder, single episode, unspecified: Secondary | ICD-10-CM | POA: Diagnosis not present

## 2015-10-15 DIAGNOSIS — M25519 Pain in unspecified shoulder: Secondary | ICD-10-CM | POA: Diagnosis not present

## 2015-12-12 DIAGNOSIS — J320 Chronic maxillary sinusitis: Secondary | ICD-10-CM | POA: Diagnosis not present

## 2015-12-12 DIAGNOSIS — I10 Essential (primary) hypertension: Secondary | ICD-10-CM | POA: Diagnosis not present

## 2015-12-12 DIAGNOSIS — G4710 Hypersomnia, unspecified: Secondary | ICD-10-CM | POA: Diagnosis not present

## 2015-12-12 DIAGNOSIS — Z79891 Long term (current) use of opiate analgesic: Secondary | ICD-10-CM | POA: Diagnosis not present

## 2015-12-12 DIAGNOSIS — R42 Dizziness and giddiness: Secondary | ICD-10-CM | POA: Diagnosis not present

## 2015-12-12 DIAGNOSIS — R2681 Unsteadiness on feet: Secondary | ICD-10-CM | POA: Diagnosis not present

## 2015-12-12 DIAGNOSIS — G500 Trigeminal neuralgia: Secondary | ICD-10-CM | POA: Diagnosis not present

## 2015-12-12 DIAGNOSIS — F329 Major depressive disorder, single episode, unspecified: Secondary | ICD-10-CM | POA: Diagnosis not present

## 2015-12-12 DIAGNOSIS — K219 Gastro-esophageal reflux disease without esophagitis: Secondary | ICD-10-CM | POA: Diagnosis not present

## 2015-12-12 DIAGNOSIS — M25519 Pain in unspecified shoulder: Secondary | ICD-10-CM | POA: Diagnosis not present

## 2015-12-12 DIAGNOSIS — G4459 Other complicated headache syndrome: Secondary | ICD-10-CM | POA: Diagnosis not present

## 2016-02-06 DIAGNOSIS — Z79899 Other long term (current) drug therapy: Secondary | ICD-10-CM | POA: Diagnosis not present

## 2016-02-06 DIAGNOSIS — F329 Major depressive disorder, single episode, unspecified: Secondary | ICD-10-CM | POA: Diagnosis not present

## 2016-02-06 DIAGNOSIS — R2681 Unsteadiness on feet: Secondary | ICD-10-CM | POA: Diagnosis not present

## 2016-02-06 DIAGNOSIS — K219 Gastro-esophageal reflux disease without esophagitis: Secondary | ICD-10-CM | POA: Diagnosis not present

## 2016-02-06 DIAGNOSIS — Z79891 Long term (current) use of opiate analgesic: Secondary | ICD-10-CM | POA: Diagnosis not present

## 2016-02-06 DIAGNOSIS — M25519 Pain in unspecified shoulder: Secondary | ICD-10-CM | POA: Diagnosis not present

## 2016-02-06 DIAGNOSIS — I10 Essential (primary) hypertension: Secondary | ICD-10-CM | POA: Diagnosis not present

## 2016-02-06 DIAGNOSIS — J320 Chronic maxillary sinusitis: Secondary | ICD-10-CM | POA: Diagnosis not present

## 2016-02-06 DIAGNOSIS — R42 Dizziness and giddiness: Secondary | ICD-10-CM | POA: Diagnosis not present

## 2016-02-06 DIAGNOSIS — G894 Chronic pain syndrome: Secondary | ICD-10-CM | POA: Diagnosis not present

## 2016-02-06 DIAGNOSIS — G4459 Other complicated headache syndrome: Secondary | ICD-10-CM | POA: Diagnosis not present

## 2016-02-06 DIAGNOSIS — G500 Trigeminal neuralgia: Secondary | ICD-10-CM | POA: Diagnosis not present

## 2016-02-14 DIAGNOSIS — G4710 Hypersomnia, unspecified: Secondary | ICD-10-CM | POA: Diagnosis not present

## 2016-02-14 DIAGNOSIS — M25519 Pain in unspecified shoulder: Secondary | ICD-10-CM | POA: Diagnosis not present

## 2016-02-14 DIAGNOSIS — G4459 Other complicated headache syndrome: Secondary | ICD-10-CM | POA: Diagnosis not present

## 2016-02-14 DIAGNOSIS — Z79899 Other long term (current) drug therapy: Secondary | ICD-10-CM | POA: Diagnosis not present

## 2016-02-14 DIAGNOSIS — R2681 Unsteadiness on feet: Secondary | ICD-10-CM | POA: Diagnosis not present

## 2016-02-14 DIAGNOSIS — I10 Essential (primary) hypertension: Secondary | ICD-10-CM | POA: Diagnosis not present

## 2016-02-14 DIAGNOSIS — G500 Trigeminal neuralgia: Secondary | ICD-10-CM | POA: Diagnosis not present

## 2016-02-14 DIAGNOSIS — R42 Dizziness and giddiness: Secondary | ICD-10-CM | POA: Diagnosis not present

## 2016-02-14 DIAGNOSIS — G894 Chronic pain syndrome: Secondary | ICD-10-CM | POA: Diagnosis not present

## 2016-03-17 ENCOUNTER — Encounter: Payer: Self-pay | Admitting: *Deleted

## 2016-03-24 ENCOUNTER — Encounter: Payer: Self-pay | Admitting: Obstetrics & Gynecology

## 2016-04-06 DIAGNOSIS — G500 Trigeminal neuralgia: Secondary | ICD-10-CM | POA: Diagnosis not present

## 2016-04-06 DIAGNOSIS — I10 Essential (primary) hypertension: Secondary | ICD-10-CM | POA: Diagnosis not present

## 2016-04-06 DIAGNOSIS — Z79899 Other long term (current) drug therapy: Secondary | ICD-10-CM | POA: Diagnosis not present

## 2016-04-06 DIAGNOSIS — G4710 Hypersomnia, unspecified: Secondary | ICD-10-CM | POA: Diagnosis not present

## 2016-04-06 DIAGNOSIS — R42 Dizziness and giddiness: Secondary | ICD-10-CM | POA: Diagnosis not present

## 2016-04-06 DIAGNOSIS — R2681 Unsteadiness on feet: Secondary | ICD-10-CM | POA: Diagnosis not present

## 2016-04-06 DIAGNOSIS — J320 Chronic maxillary sinusitis: Secondary | ICD-10-CM | POA: Diagnosis not present

## 2016-04-06 DIAGNOSIS — G894 Chronic pain syndrome: Secondary | ICD-10-CM | POA: Diagnosis not present

## 2016-04-06 DIAGNOSIS — F329 Major depressive disorder, single episode, unspecified: Secondary | ICD-10-CM | POA: Diagnosis not present

## 2016-04-06 DIAGNOSIS — G4459 Other complicated headache syndrome: Secondary | ICD-10-CM | POA: Diagnosis not present

## 2016-04-06 DIAGNOSIS — K219 Gastro-esophageal reflux disease without esophagitis: Secondary | ICD-10-CM | POA: Diagnosis not present

## 2016-04-06 DIAGNOSIS — M25519 Pain in unspecified shoulder: Secondary | ICD-10-CM | POA: Diagnosis not present

## 2016-04-21 ENCOUNTER — Ambulatory Visit: Payer: Self-pay | Admitting: Obstetrics and Gynecology

## 2016-04-29 ENCOUNTER — Ambulatory Visit: Payer: Self-pay | Admitting: Obstetrics and Gynecology

## 2016-05-07 DIAGNOSIS — Z79899 Other long term (current) drug therapy: Secondary | ICD-10-CM | POA: Diagnosis not present

## 2016-05-07 DIAGNOSIS — G4459 Other complicated headache syndrome: Secondary | ICD-10-CM | POA: Diagnosis not present

## 2016-05-07 DIAGNOSIS — R42 Dizziness and giddiness: Secondary | ICD-10-CM | POA: Diagnosis not present

## 2016-05-07 DIAGNOSIS — K219 Gastro-esophageal reflux disease without esophagitis: Secondary | ICD-10-CM | POA: Diagnosis not present

## 2016-05-07 DIAGNOSIS — R2681 Unsteadiness on feet: Secondary | ICD-10-CM | POA: Diagnosis not present

## 2016-05-07 DIAGNOSIS — M545 Low back pain: Secondary | ICD-10-CM | POA: Diagnosis not present

## 2016-05-07 DIAGNOSIS — G4710 Hypersomnia, unspecified: Secondary | ICD-10-CM | POA: Diagnosis not present

## 2016-05-07 DIAGNOSIS — F329 Major depressive disorder, single episode, unspecified: Secondary | ICD-10-CM | POA: Diagnosis not present

## 2016-05-07 DIAGNOSIS — G500 Trigeminal neuralgia: Secondary | ICD-10-CM | POA: Diagnosis not present

## 2016-05-07 DIAGNOSIS — J320 Chronic maxillary sinusitis: Secondary | ICD-10-CM | POA: Diagnosis not present

## 2016-05-07 DIAGNOSIS — M25519 Pain in unspecified shoulder: Secondary | ICD-10-CM | POA: Diagnosis not present

## 2016-05-07 DIAGNOSIS — I10 Essential (primary) hypertension: Secondary | ICD-10-CM | POA: Diagnosis not present

## 2016-05-18 ENCOUNTER — Ambulatory Visit: Payer: Self-pay | Admitting: Obstetrics and Gynecology

## 2016-06-04 DIAGNOSIS — K219 Gastro-esophageal reflux disease without esophagitis: Secondary | ICD-10-CM | POA: Diagnosis not present

## 2016-06-04 DIAGNOSIS — G4710 Hypersomnia, unspecified: Secondary | ICD-10-CM | POA: Diagnosis not present

## 2016-06-04 DIAGNOSIS — F329 Major depressive disorder, single episode, unspecified: Secondary | ICD-10-CM | POA: Diagnosis not present

## 2016-06-04 DIAGNOSIS — I10 Essential (primary) hypertension: Secondary | ICD-10-CM | POA: Diagnosis not present

## 2016-06-04 DIAGNOSIS — G500 Trigeminal neuralgia: Secondary | ICD-10-CM | POA: Diagnosis not present

## 2016-06-04 DIAGNOSIS — G4459 Other complicated headache syndrome: Secondary | ICD-10-CM | POA: Diagnosis not present

## 2016-06-04 DIAGNOSIS — G894 Chronic pain syndrome: Secondary | ICD-10-CM | POA: Diagnosis not present

## 2016-06-04 DIAGNOSIS — J320 Chronic maxillary sinusitis: Secondary | ICD-10-CM | POA: Diagnosis not present

## 2016-06-04 DIAGNOSIS — M25519 Pain in unspecified shoulder: Secondary | ICD-10-CM | POA: Diagnosis not present

## 2016-06-04 DIAGNOSIS — R42 Dizziness and giddiness: Secondary | ICD-10-CM | POA: Diagnosis not present

## 2016-06-04 DIAGNOSIS — Z79899 Other long term (current) drug therapy: Secondary | ICD-10-CM | POA: Diagnosis not present

## 2016-06-04 DIAGNOSIS — M545 Low back pain: Secondary | ICD-10-CM | POA: Diagnosis not present

## 2016-06-12 DIAGNOSIS — G4459 Other complicated headache syndrome: Secondary | ICD-10-CM | POA: Diagnosis not present

## 2016-06-12 DIAGNOSIS — G500 Trigeminal neuralgia: Secondary | ICD-10-CM | POA: Diagnosis not present

## 2016-07-03 DIAGNOSIS — R0902 Hypoxemia: Secondary | ICD-10-CM | POA: Diagnosis not present

## 2016-07-03 DIAGNOSIS — E222 Syndrome of inappropriate secretion of antidiuretic hormone: Secondary | ICD-10-CM | POA: Diagnosis not present

## 2016-07-03 DIAGNOSIS — T1490XS Injury, unspecified, sequela: Secondary | ICD-10-CM | POA: Diagnosis not present

## 2016-07-03 DIAGNOSIS — Z809 Family history of malignant neoplasm, unspecified: Secondary | ICD-10-CM | POA: Diagnosis not present

## 2016-07-03 DIAGNOSIS — G894 Chronic pain syndrome: Secondary | ICD-10-CM | POA: Diagnosis present

## 2016-07-03 DIAGNOSIS — R0602 Shortness of breath: Secondary | ICD-10-CM | POA: Diagnosis not present

## 2016-07-03 DIAGNOSIS — M1990 Unspecified osteoarthritis, unspecified site: Secondary | ICD-10-CM | POA: Diagnosis present

## 2016-07-03 DIAGNOSIS — J189 Pneumonia, unspecified organism: Secondary | ICD-10-CM | POA: Diagnosis not present

## 2016-07-03 DIAGNOSIS — R069 Unspecified abnormalities of breathing: Secondary | ICD-10-CM | POA: Diagnosis not present

## 2016-07-03 DIAGNOSIS — Z7982 Long term (current) use of aspirin: Secondary | ICD-10-CM | POA: Diagnosis not present

## 2016-07-03 DIAGNOSIS — R69 Illness, unspecified: Secondary | ICD-10-CM | POA: Diagnosis not present

## 2016-07-03 DIAGNOSIS — Z6841 Body Mass Index (BMI) 40.0 and over, adult: Secondary | ICD-10-CM | POA: Diagnosis not present

## 2016-07-03 DIAGNOSIS — A084 Viral intestinal infection, unspecified: Secondary | ICD-10-CM | POA: Diagnosis present

## 2016-07-03 DIAGNOSIS — F17200 Nicotine dependence, unspecified, uncomplicated: Secondary | ICD-10-CM | POA: Diagnosis present

## 2016-07-03 DIAGNOSIS — Z79899 Other long term (current) drug therapy: Secondary | ICD-10-CM | POA: Diagnosis not present

## 2016-07-03 DIAGNOSIS — Z886 Allergy status to analgesic agent status: Secondary | ICD-10-CM | POA: Diagnosis not present

## 2016-07-03 DIAGNOSIS — G4733 Obstructive sleep apnea (adult) (pediatric): Secondary | ICD-10-CM | POA: Diagnosis not present

## 2016-07-03 DIAGNOSIS — E871 Hypo-osmolality and hyponatremia: Secondary | ICD-10-CM | POA: Diagnosis not present

## 2016-07-03 DIAGNOSIS — K219 Gastro-esophageal reflux disease without esophagitis: Secondary | ICD-10-CM | POA: Diagnosis present

## 2016-07-03 DIAGNOSIS — I10 Essential (primary) hypertension: Secondary | ICD-10-CM | POA: Diagnosis present

## 2016-07-03 DIAGNOSIS — Z880 Allergy status to penicillin: Secondary | ICD-10-CM | POA: Diagnosis not present

## 2016-08-05 DIAGNOSIS — G4459 Other complicated headache syndrome: Secondary | ICD-10-CM | POA: Diagnosis not present

## 2016-08-05 DIAGNOSIS — Z79891 Long term (current) use of opiate analgesic: Secondary | ICD-10-CM | POA: Diagnosis not present

## 2016-08-05 DIAGNOSIS — G4710 Hypersomnia, unspecified: Secondary | ICD-10-CM | POA: Diagnosis not present

## 2016-08-05 DIAGNOSIS — K219 Gastro-esophageal reflux disease without esophagitis: Secondary | ICD-10-CM | POA: Diagnosis not present

## 2016-08-05 DIAGNOSIS — F329 Major depressive disorder, single episode, unspecified: Secondary | ICD-10-CM | POA: Diagnosis not present

## 2016-08-05 DIAGNOSIS — I10 Essential (primary) hypertension: Secondary | ICD-10-CM | POA: Diagnosis not present

## 2016-08-05 DIAGNOSIS — R42 Dizziness and giddiness: Secondary | ICD-10-CM | POA: Diagnosis not present

## 2016-08-05 DIAGNOSIS — J320 Chronic maxillary sinusitis: Secondary | ICD-10-CM | POA: Diagnosis not present

## 2016-08-05 DIAGNOSIS — G894 Chronic pain syndrome: Secondary | ICD-10-CM | POA: Diagnosis not present

## 2016-08-05 DIAGNOSIS — G500 Trigeminal neuralgia: Secondary | ICD-10-CM | POA: Diagnosis not present

## 2016-08-05 DIAGNOSIS — M545 Low back pain: Secondary | ICD-10-CM | POA: Diagnosis not present

## 2016-08-05 DIAGNOSIS — M25519 Pain in unspecified shoulder: Secondary | ICD-10-CM | POA: Diagnosis not present

## 2016-08-19 DIAGNOSIS — G8929 Other chronic pain: Secondary | ICD-10-CM | POA: Diagnosis not present

## 2016-08-19 DIAGNOSIS — M67442 Ganglion, left hand: Secondary | ICD-10-CM | POA: Diagnosis not present

## 2016-08-19 DIAGNOSIS — M25511 Pain in right shoulder: Secondary | ICD-10-CM | POA: Diagnosis not present

## 2016-08-19 DIAGNOSIS — M25561 Pain in right knee: Secondary | ICD-10-CM | POA: Diagnosis not present

## 2016-08-19 DIAGNOSIS — M25512 Pain in left shoulder: Secondary | ICD-10-CM | POA: Diagnosis not present

## 2016-08-19 DIAGNOSIS — J3489 Other specified disorders of nose and nasal sinuses: Secondary | ICD-10-CM | POA: Diagnosis not present

## 2016-08-19 DIAGNOSIS — M25562 Pain in left knee: Secondary | ICD-10-CM | POA: Diagnosis not present

## 2016-08-19 DIAGNOSIS — J449 Chronic obstructive pulmonary disease, unspecified: Secondary | ICD-10-CM | POA: Diagnosis not present

## 2016-10-05 DIAGNOSIS — G4459 Other complicated headache syndrome: Secondary | ICD-10-CM | POA: Diagnosis not present

## 2016-10-05 DIAGNOSIS — R42 Dizziness and giddiness: Secondary | ICD-10-CM | POA: Diagnosis not present

## 2016-10-05 DIAGNOSIS — G894 Chronic pain syndrome: Secondary | ICD-10-CM | POA: Diagnosis not present

## 2016-10-05 DIAGNOSIS — G4710 Hypersomnia, unspecified: Secondary | ICD-10-CM | POA: Diagnosis not present

## 2016-10-05 DIAGNOSIS — M25519 Pain in unspecified shoulder: Secondary | ICD-10-CM | POA: Diagnosis not present

## 2016-10-05 DIAGNOSIS — M545 Low back pain: Secondary | ICD-10-CM | POA: Diagnosis not present

## 2016-10-05 DIAGNOSIS — F329 Major depressive disorder, single episode, unspecified: Secondary | ICD-10-CM | POA: Diagnosis not present

## 2016-10-05 DIAGNOSIS — G500 Trigeminal neuralgia: Secondary | ICD-10-CM | POA: Diagnosis not present

## 2016-10-05 DIAGNOSIS — J320 Chronic maxillary sinusitis: Secondary | ICD-10-CM | POA: Diagnosis not present

## 2016-10-05 DIAGNOSIS — Z79891 Long term (current) use of opiate analgesic: Secondary | ICD-10-CM | POA: Diagnosis not present

## 2016-10-05 DIAGNOSIS — K219 Gastro-esophageal reflux disease without esophagitis: Secondary | ICD-10-CM | POA: Diagnosis not present

## 2016-10-05 DIAGNOSIS — I10 Essential (primary) hypertension: Secondary | ICD-10-CM | POA: Diagnosis not present

## 2016-11-12 ENCOUNTER — Ambulatory Visit (INDEPENDENT_AMBULATORY_CARE_PROVIDER_SITE_OTHER): Payer: Medicare Other | Admitting: Otolaryngology

## 2016-12-03 ENCOUNTER — Ambulatory Visit (INDEPENDENT_AMBULATORY_CARE_PROVIDER_SITE_OTHER): Payer: Medicare Other | Admitting: Otolaryngology

## 2016-12-08 DIAGNOSIS — G500 Trigeminal neuralgia: Secondary | ICD-10-CM | POA: Diagnosis not present

## 2016-12-08 DIAGNOSIS — I10 Essential (primary) hypertension: Secondary | ICD-10-CM | POA: Diagnosis not present

## 2016-12-08 DIAGNOSIS — Z79891 Long term (current) use of opiate analgesic: Secondary | ICD-10-CM | POA: Diagnosis not present

## 2016-12-08 DIAGNOSIS — M545 Low back pain: Secondary | ICD-10-CM | POA: Diagnosis not present

## 2017-01-06 DIAGNOSIS — Z79891 Long term (current) use of opiate analgesic: Secondary | ICD-10-CM | POA: Diagnosis not present

## 2017-01-06 DIAGNOSIS — G500 Trigeminal neuralgia: Secondary | ICD-10-CM | POA: Diagnosis not present

## 2017-01-06 DIAGNOSIS — G4459 Other complicated headache syndrome: Secondary | ICD-10-CM | POA: Diagnosis not present

## 2017-01-06 DIAGNOSIS — M545 Low back pain: Secondary | ICD-10-CM | POA: Diagnosis not present

## 2017-03-09 DIAGNOSIS — M545 Low back pain: Secondary | ICD-10-CM | POA: Diagnosis not present

## 2017-03-09 DIAGNOSIS — G500 Trigeminal neuralgia: Secondary | ICD-10-CM | POA: Diagnosis not present

## 2017-03-09 DIAGNOSIS — G4459 Other complicated headache syndrome: Secondary | ICD-10-CM | POA: Diagnosis not present

## 2017-03-09 DIAGNOSIS — E668 Other obesity: Secondary | ICD-10-CM | POA: Diagnosis not present

## 2017-04-28 DIAGNOSIS — G8929 Other chronic pain: Secondary | ICD-10-CM | POA: Diagnosis not present

## 2017-04-28 DIAGNOSIS — J449 Chronic obstructive pulmonary disease, unspecified: Secondary | ICD-10-CM | POA: Diagnosis not present

## 2017-04-28 DIAGNOSIS — F4323 Adjustment disorder with mixed anxiety and depressed mood: Secondary | ICD-10-CM | POA: Diagnosis not present

## 2017-04-28 DIAGNOSIS — N62 Hypertrophy of breast: Secondary | ICD-10-CM | POA: Diagnosis not present

## 2017-04-28 DIAGNOSIS — S0992XD Unspecified injury of nose, subsequent encounter: Secondary | ICD-10-CM | POA: Diagnosis not present

## 2017-05-06 DIAGNOSIS — E668 Other obesity: Secondary | ICD-10-CM | POA: Diagnosis not present

## 2017-05-06 DIAGNOSIS — M545 Low back pain: Secondary | ICD-10-CM | POA: Diagnosis not present

## 2017-05-06 DIAGNOSIS — G500 Trigeminal neuralgia: Secondary | ICD-10-CM | POA: Diagnosis not present

## 2017-05-06 DIAGNOSIS — G4459 Other complicated headache syndrome: Secondary | ICD-10-CM | POA: Diagnosis not present

## 2017-07-07 ENCOUNTER — Other Ambulatory Visit: Payer: Self-pay

## 2017-07-07 ENCOUNTER — Emergency Department (HOSPITAL_COMMUNITY): Payer: Medicare Other

## 2017-07-07 ENCOUNTER — Encounter (HOSPITAL_COMMUNITY): Payer: Self-pay | Admitting: Emergency Medicine

## 2017-07-07 ENCOUNTER — Observation Stay (HOSPITAL_COMMUNITY)
Admission: EM | Admit: 2017-07-07 | Discharge: 2017-07-09 | Disposition: A | Discharge status: Home / Self Care | Payer: Medicare Other | Attending: Internal Medicine | Admitting: Internal Medicine

## 2017-07-07 DIAGNOSIS — D509 Iron deficiency anemia, unspecified: Secondary | ICD-10-CM | POA: Diagnosis not present

## 2017-07-07 DIAGNOSIS — G4730 Sleep apnea, unspecified: Secondary | ICD-10-CM | POA: Insufficient documentation

## 2017-07-07 DIAGNOSIS — R569 Unspecified convulsions: Secondary | ICD-10-CM | POA: Diagnosis not present

## 2017-07-07 DIAGNOSIS — E7800 Pure hypercholesterolemia, unspecified: Secondary | ICD-10-CM | POA: Diagnosis not present

## 2017-07-07 DIAGNOSIS — Z79899 Other long term (current) drug therapy: Secondary | ICD-10-CM | POA: Insufficient documentation

## 2017-07-07 DIAGNOSIS — Z6841 Body Mass Index (BMI) 40.0 and over, adult: Secondary | ICD-10-CM | POA: Diagnosis not present

## 2017-07-07 DIAGNOSIS — G4710 Hypersomnia, unspecified: Secondary | ICD-10-CM | POA: Diagnosis not present

## 2017-07-07 DIAGNOSIS — E876 Hypokalemia: Secondary | ICD-10-CM | POA: Insufficient documentation

## 2017-07-07 DIAGNOSIS — N920 Excessive and frequent menstruation with regular cycle: Secondary | ICD-10-CM | POA: Diagnosis not present

## 2017-07-07 DIAGNOSIS — G894 Chronic pain syndrome: Secondary | ICD-10-CM | POA: Diagnosis not present

## 2017-07-07 DIAGNOSIS — Z888 Allergy status to other drugs, medicaments and biological substances status: Secondary | ICD-10-CM | POA: Insufficient documentation

## 2017-07-07 DIAGNOSIS — R270 Ataxia, unspecified: Secondary | ICD-10-CM | POA: Diagnosis not present

## 2017-07-07 DIAGNOSIS — Z885 Allergy status to narcotic agent status: Secondary | ICD-10-CM | POA: Diagnosis not present

## 2017-07-07 DIAGNOSIS — I2510 Atherosclerotic heart disease of native coronary artery without angina pectoris: Secondary | ICD-10-CM | POA: Insufficient documentation

## 2017-07-07 DIAGNOSIS — D649 Anemia, unspecified: Secondary | ICD-10-CM

## 2017-07-07 DIAGNOSIS — R400 Somnolence: Secondary | ICD-10-CM | POA: Diagnosis not present

## 2017-07-07 DIAGNOSIS — R402441 Other coma, without documented Glasgow coma scale score, or with partial score reported, in the field [EMT or ambulance]: Secondary | ICD-10-CM | POA: Diagnosis not present

## 2017-07-07 DIAGNOSIS — E871 Hypo-osmolality and hyponatremia: Secondary | ICD-10-CM | POA: Diagnosis not present

## 2017-07-07 DIAGNOSIS — I252 Old myocardial infarction: Secondary | ICD-10-CM | POA: Diagnosis not present

## 2017-07-07 DIAGNOSIS — H538 Other visual disturbances: Secondary | ICD-10-CM | POA: Diagnosis not present

## 2017-07-07 DIAGNOSIS — R4182 Altered mental status, unspecified: Secondary | ICD-10-CM

## 2017-07-07 DIAGNOSIS — E860 Dehydration: Secondary | ICD-10-CM | POA: Diagnosis not present

## 2017-07-07 DIAGNOSIS — I10 Essential (primary) hypertension: Secondary | ICD-10-CM | POA: Diagnosis not present

## 2017-07-07 DIAGNOSIS — Z87891 Personal history of nicotine dependence: Secondary | ICD-10-CM | POA: Diagnosis not present

## 2017-07-07 DIAGNOSIS — F419 Anxiety disorder, unspecified: Secondary | ICD-10-CM | POA: Insufficient documentation

## 2017-07-07 DIAGNOSIS — G43909 Migraine, unspecified, not intractable, without status migrainosus: Secondary | ICD-10-CM | POA: Diagnosis not present

## 2017-07-07 DIAGNOSIS — F329 Major depressive disorder, single episode, unspecified: Secondary | ICD-10-CM | POA: Diagnosis not present

## 2017-07-07 DIAGNOSIS — R0689 Other abnormalities of breathing: Secondary | ICD-10-CM | POA: Diagnosis not present

## 2017-07-07 DIAGNOSIS — J45909 Unspecified asthma, uncomplicated: Secondary | ICD-10-CM | POA: Diagnosis not present

## 2017-07-07 DIAGNOSIS — K219 Gastro-esophageal reflux disease without esophagitis: Secondary | ICD-10-CM | POA: Insufficient documentation

## 2017-07-07 DIAGNOSIS — R404 Transient alteration of awareness: Secondary | ICD-10-CM | POA: Diagnosis not present

## 2017-07-07 DIAGNOSIS — R42 Dizziness and giddiness: Secondary | ICD-10-CM | POA: Diagnosis not present

## 2017-07-07 DIAGNOSIS — R0902 Hypoxemia: Secondary | ICD-10-CM | POA: Diagnosis not present

## 2017-07-07 DIAGNOSIS — R55 Syncope and collapse: Secondary | ICD-10-CM | POA: Diagnosis not present

## 2017-07-07 LAB — COMPREHENSIVE METABOLIC PANEL WITH GFR
ALT: 15 U/L (ref 14–54)
AST: 11 U/L — ABNORMAL LOW (ref 15–41)
Albumin: 3.6 g/dL (ref 3.5–5.0)
Alkaline Phosphatase: 59 U/L (ref 38–126)
Anion gap: 9 (ref 5–15)
BUN: 10 mg/dL (ref 6–20)
CO2: 25 mmol/L (ref 22–32)
Calcium: 8.5 mg/dL — ABNORMAL LOW (ref 8.9–10.3)
Chloride: 87 mmol/L — ABNORMAL LOW (ref 101–111)
Creatinine, Ser: 0.58 mg/dL (ref 0.44–1.00)
GFR calc Af Amer: >60 mL/min
GFR calc non Af Amer: >60 mL/min
Glucose, Bld: 91 mg/dL (ref 65–99)
Potassium: 3.3 mmol/L — ABNORMAL LOW (ref 3.5–5.1)
Sodium: 121 mmol/L — ABNORMAL LOW (ref 135–145)
Total Bilirubin: 0.5 mg/dL (ref 0.3–1.2)
Total Protein: 7 g/dL (ref 6.5–8.1)

## 2017-07-07 LAB — I-STAT BETA HCG BLOOD, ED (MC, WL, AP ONLY): I-stat hCG, quantitative: <5 m[IU]/mL

## 2017-07-07 LAB — BLOOD GAS, ARTERIAL
ACID-BASE EXCESS: 0.1 mmol/L (ref 0.0–2.0)
Bicarbonate: 24.4 mmol/L (ref 20.0–28.0)
DRAWN BY: 284591
FIO2: 21
O2 SAT: 94.2 %
PCO2 ART: 40.9 mmHg (ref 32.0–48.0)
Patient temperature: 37
pH, Arterial: 7.393 (ref 7.350–7.450)
pO2, Arterial: 75.2 mmHg — ABNORMAL LOW (ref 83.0–108.0)

## 2017-07-07 LAB — CBC
HCT: 26.8 % — ABNORMAL LOW (ref 36.0–46.0)
Hemoglobin: 7.6 g/dL — ABNORMAL LOW (ref 12.0–15.0)
MCH: 17 pg — ABNORMAL LOW (ref 26.0–34.0)
MCHC: 28.4 g/dL — ABNORMAL LOW (ref 30.0–36.0)
MCV: 59.8 fL — ABNORMAL LOW (ref 78.0–100.0)
Platelets: 399 K/uL (ref 150–400)
RBC: 4.48 MIL/uL (ref 3.87–5.11)
RDW: 19.9 % — ABNORMAL HIGH (ref 11.5–15.5)
WBC: 8.8 K/uL (ref 4.0–10.5)

## 2017-07-07 LAB — URINALYSIS, ROUTINE W REFLEX MICROSCOPIC
Bilirubin Urine: NEGATIVE
Glucose, UA: NEGATIVE mg/dL
HGB URINE DIPSTICK: NEGATIVE
Ketones, ur: NEGATIVE mg/dL
Leukocytes, UA: NEGATIVE
Nitrite: NEGATIVE
Protein, ur: NEGATIVE mg/dL
SPECIFIC GRAVITY, URINE: 1.015 (ref 1.005–1.030)
pH: 6 (ref 5.0–8.0)

## 2017-07-07 LAB — RAPID URINE DRUG SCREEN, HOSP PERFORMED
Amphetamines: NOT DETECTED
Barbiturates: NOT DETECTED
Benzodiazepines: NOT DETECTED
Cocaine: NOT DETECTED
Opiates: NOT DETECTED
Tetrahydrocannabinol: NOT DETECTED

## 2017-07-07 LAB — ACETAMINOPHEN LEVEL: Acetaminophen (Tylenol), Serum: <10 ug/mL — ABNORMAL LOW (ref 10–30)

## 2017-07-07 LAB — CBG MONITORING, ED: Glucose-Capillary: 92 mg/dL (ref 65–99)

## 2017-07-07 LAB — LACTIC ACID, PLASMA: Lactic Acid, Venous: 1 mmol/L (ref 0.5–1.9)

## 2017-07-07 LAB — ETHANOL: Alcohol, Ethyl (B): <10 mg/dL

## 2017-07-07 LAB — LIPASE, BLOOD: Lipase: 24 U/L (ref 11–51)

## 2017-07-07 LAB — POC OCCULT BLOOD, ED: FECAL OCCULT BLD: NEGATIVE

## 2017-07-07 MED ORDER — NALOXONE HCL 0.4 MG/ML IJ SOLN
0.4000 mg | Freq: Once | INTRAMUSCULAR | Status: AC
  Administered 2017-07-07: 0.4 mg via INTRAVENOUS
  Filled 2017-07-07: qty 1

## 2017-07-07 MED ORDER — NALOXONE HCL 4 MG/10ML IJ SOLN
0.5000 mg/h | INTRAVENOUS | Status: DC
  Filled 2017-07-07: qty 10

## 2017-07-07 MED ORDER — SODIUM CHLORIDE 0.9 % IV BOLUS
1000.0000 mL | Freq: Once | INTRAVENOUS | Status: AC
  Administered 2017-07-07: 1000 mL via INTRAVENOUS

## 2017-07-07 MED ORDER — SODIUM CHLORIDE 0.9 % IV SOLN: INTRAVENOUS | Status: DC

## 2017-07-07 NOTE — ED Notes (Signed)
Patient states she is unable to move her arms and legs but moves them when uncomfortable. Patient speaking slowly at times, at other times speech is clear. Pupils equal and reactive to light. Patient oriented, denies use of narcotics. Per EMS report patient had syncopal or near syncopal episode at Drs" office.

## 2017-07-07 NOTE — ED Notes (Signed)
Report given to Yvette, RN
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
## 2017-07-07 NOTE — ED Triage Notes (Signed)
Patient was at Dr' office, had syncopal episode. EMS reports no seizure activity.

## 2017-07-07 NOTE — ED Notes (Signed)
Patient sleeping, second dose of Narcan ineffective, Dr. Rogene Houston informed, verbal order to not start Narcan drip.

## 2017-07-08 ENCOUNTER — Encounter (HOSPITAL_COMMUNITY): Payer: Self-pay | Admitting: *Deleted

## 2017-07-08 ENCOUNTER — Other Ambulatory Visit: Payer: Self-pay

## 2017-07-08 DIAGNOSIS — G4710 Hypersomnia, unspecified: Secondary | ICD-10-CM | POA: Diagnosis present

## 2017-07-08 DIAGNOSIS — E876 Hypokalemia: Secondary | ICD-10-CM | POA: Diagnosis not present

## 2017-07-08 DIAGNOSIS — D649 Anemia, unspecified: Secondary | ICD-10-CM

## 2017-07-08 DIAGNOSIS — I10 Essential (primary) hypertension: Secondary | ICD-10-CM | POA: Diagnosis not present

## 2017-07-08 DIAGNOSIS — R4182 Altered mental status, unspecified: Secondary | ICD-10-CM | POA: Diagnosis not present

## 2017-07-08 DIAGNOSIS — E871 Hypo-osmolality and hyponatremia: Secondary | ICD-10-CM | POA: Diagnosis not present

## 2017-07-08 LAB — MRSA PCR SCREENING: MRSA by PCR: NEGATIVE

## 2017-07-08 LAB — RETICULOCYTES
RBC.: 4.39 MIL/uL (ref 3.87–5.11)
RETIC CT PCT: 2 % (ref 0.4–3.1)
Retic Count, Absolute: 87.8 10*3/uL (ref 19.0–186.0)

## 2017-07-08 LAB — IRON AND TIBC
Iron: 18 ug/dL — ABNORMAL LOW (ref 28–170)
SATURATION RATIOS: 3 % — AB (ref 10.4–31.8)
TIBC: 540 ug/dL — AB (ref 250–450)
UIBC: 522 ug/dL

## 2017-07-08 LAB — COMPREHENSIVE METABOLIC PANEL
ALT: 15 U/L (ref 14–54)
AST: 12 U/L — AB (ref 15–41)
Albumin: 3.4 g/dL — ABNORMAL LOW (ref 3.5–5.0)
Alkaline Phosphatase: 60 U/L (ref 38–126)
Anion gap: 8 (ref 5–15)
BUN: 5 mg/dL — AB (ref 6–20)
CHLORIDE: 88 mmol/L — AB (ref 101–111)
CO2: 25 mmol/L (ref 22–32)
CREATININE: 0.47 mg/dL (ref 0.44–1.00)
Calcium: 8.5 mg/dL — ABNORMAL LOW (ref 8.9–10.3)
GFR calc Af Amer: >60 mL/min (ref >60)
GFR calc non Af Amer: >60 mL/min (ref >60)
GLUCOSE: 102 mg/dL — AB (ref 65–99)
POTASSIUM: 3.4 mmol/L — AB (ref 3.5–5.1)
SODIUM: 121 mmol/L — AB (ref 135–145)
Total Bilirubin: 0.4 mg/dL (ref 0.3–1.2)
Total Protein: 6.6 g/dL (ref 6.5–8.1)

## 2017-07-08 LAB — CBC
HCT: 26 % — ABNORMAL LOW (ref 36.0–46.0)
Hemoglobin: 7.5 g/dL — ABNORMAL LOW (ref 12.0–15.0)
MCH: 17.2 pg — ABNORMAL LOW (ref 26.0–34.0)
MCHC: 28.8 g/dL — ABNORMAL LOW (ref 30.0–36.0)
MCV: 59.6 fL — ABNORMAL LOW (ref 78.0–100.0)
PLATELETS: 366 10*3/uL (ref 150–400)
RBC: 4.36 MIL/uL (ref 3.87–5.11)
RDW: 19.6 % — AB (ref 11.5–15.5)
WBC: 6 10*3/uL (ref 4.0–10.5)

## 2017-07-08 LAB — FERRITIN: Ferritin: 7 ng/mL — ABNORMAL LOW (ref 11–307)

## 2017-07-08 LAB — OSMOLALITY: Osmolality: 246 mOsm/kg — CL (ref 275–295)

## 2017-07-08 LAB — FOLATE: Folate: 11.7 ng/mL (ref >5.9)

## 2017-07-08 LAB — VITAMIN B12: VITAMIN B 12: 238 pg/mL (ref 180–914)

## 2017-07-08 MED ORDER — SIMVASTATIN 20 MG PO TABS
40.0000 mg | ORAL_TABLET | Freq: Every day | ORAL | Status: DC
Start: 2017-07-08 — End: 2017-07-09
  Administered 2017-07-08: 40 mg via ORAL
  Filled 2017-07-08: qty 2

## 2017-07-08 MED ORDER — OXCARBAZEPINE 300 MG PO TABS
1200.0000 mg | ORAL_TABLET | Freq: Two times a day (BID) | ORAL | Status: DC
  Administered 2017-07-08 – 2017-07-09 (×3): 1200 mg via ORAL
  Filled 2017-07-08 (×9): qty 4

## 2017-07-08 MED ORDER — SODIUM CHLORIDE 0.9 % IV SOLN
INTRAVENOUS | Status: DC
  Administered 2017-07-08: 02:00:00 via INTRAVENOUS

## 2017-07-08 MED ORDER — ENOXAPARIN SODIUM 80 MG/0.8ML ~~LOC~~ SOLN
0.5000 mg/kg | SUBCUTANEOUS | Status: DC
  Administered 2017-07-08 – 2017-07-09 (×2): 65 mg via SUBCUTANEOUS
  Filled 2017-07-08 (×2): qty 0.8

## 2017-07-08 MED ORDER — POTASSIUM CHLORIDE 10 MEQ/100ML IV SOLN
10.0000 meq | Freq: Once | INTRAVENOUS | Status: AC
  Administered 2017-07-08: 10 meq via INTRAVENOUS
  Filled 2017-07-08: qty 100

## 2017-07-08 MED ORDER — ONDANSETRON HCL 4 MG/2ML IJ SOLN: 4.0000 mg | Freq: Four times a day (QID) | INTRAMUSCULAR | Status: DC | PRN

## 2017-07-08 MED ORDER — PANTOPRAZOLE SODIUM 40 MG PO TBEC
40.0000 mg | DELAYED_RELEASE_TABLET | Freq: Every day | ORAL | Status: DC
  Administered 2017-07-08 – 2017-07-09 (×2): 40 mg via ORAL
  Filled 2017-07-08 (×2): qty 1

## 2017-07-08 MED ORDER — PREGABALIN 75 MG PO CAPS
300.0000 mg | ORAL_CAPSULE | Freq: Two times a day (BID) | ORAL | Status: DC
  Administered 2017-07-08 – 2017-07-09 (×3): 300 mg via ORAL
  Filled 2017-07-08 (×3): qty 4

## 2017-07-08 MED ORDER — ACETAMINOPHEN 325 MG PO TABS
650.0000 mg | ORAL_TABLET | Freq: Four times a day (QID) | ORAL | Status: DC | PRN
  Administered 2017-07-08 (×2): 650 mg via ORAL
  Filled 2017-07-08 (×2): qty 2

## 2017-07-08 MED ORDER — ONDANSETRON HCL 4 MG PO TABS: 4.0000 mg | ORAL_TABLET | Freq: Four times a day (QID) | ORAL | Status: DC | PRN

## 2017-07-08 MED ORDER — ALBUTEROL SULFATE (2.5 MG/3ML) 0.083% IN NEBU: 3.0000 mL | INHALATION_SOLUTION | Freq: Four times a day (QID) | RESPIRATORY_TRACT | Status: DC | PRN

## 2017-07-08 MED ORDER — MOMETASONE FURO-FORMOTEROL FUM 200-5 MCG/ACT IN AERO
2.0000 | INHALATION_SPRAY | Freq: Two times a day (BID) | RESPIRATORY_TRACT | Status: DC
  Administered 2017-07-08 – 2017-07-09 (×3): 2 via RESPIRATORY_TRACT
  Filled 2017-07-08 (×2): qty 8.8
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
## 2017-07-08 NOTE — Care Management Obs Status (Signed)
[place] NOTIFICATION   Patient Details  Name: Christine Jenkins MRN: [id number] Date of Birth: 03/01/1970   Medicare Observation Status Notification Given:  Yes    Jeb Schloemer, Chauncey [place], RN 07/08/2017, 11:22 AM

## 2017-07-08 NOTE — Progress Notes (Signed)
Patient seen and examined. Admitted after midnight secondary to hypersomnolence and AMS. Patient found with hyponatremia, metabolic alkalosis and suspected narcosis from chronic pain meds and suspected underlying OSA/OHS. VS and significantly improved after Narcan given. Will continue holding narcotics, start CPAP empirically, continue IVF's, follow electrolytes trend. Please refer to H&P written by Dr. Darrick Meigs for further info/details on admission.  Barton Dubois MD [phone]

## 2017-07-08 NOTE — Progress Notes (Signed)
Lab called critical value of 246 serum osmolity

## 2017-07-08 NOTE — Progress Notes (Signed)
Pt refused CPAP

## 2017-07-09 DIAGNOSIS — D509 Iron deficiency anemia, unspecified: Secondary | ICD-10-CM | POA: Diagnosis not present

## 2017-07-09 DIAGNOSIS — R4182 Altered mental status, unspecified: Secondary | ICD-10-CM

## 2017-07-09 DIAGNOSIS — E871 Hypo-osmolality and hyponatremia: Secondary | ICD-10-CM

## 2017-07-09 DIAGNOSIS — I10 Essential (primary) hypertension: Secondary | ICD-10-CM

## 2017-07-09 DIAGNOSIS — E876 Hypokalemia: Secondary | ICD-10-CM | POA: Diagnosis not present

## 2017-07-09 DIAGNOSIS — G4710 Hypersomnia, unspecified: Secondary | ICD-10-CM | POA: Diagnosis not present

## 2017-07-09 LAB — BASIC METABOLIC PANEL
ANION GAP: 7 (ref 5–15)
BUN: 7 mg/dL (ref 6–20)
CALCIUM: 8.6 mg/dL — AB (ref 8.9–10.3)
CO2: 27 mmol/L (ref 22–32)
CREATININE: 0.59 mg/dL (ref 0.44–1.00)
Chloride: 94 mmol/L — ABNORMAL LOW (ref 101–111)
GLUCOSE: 166 mg/dL — AB (ref 65–99)
Potassium: 4.1 mmol/L (ref 3.5–5.1)
Sodium: 128 mmol/L — ABNORMAL LOW (ref 135–145)

## 2017-07-09 LAB — CBC
HCT: 26.3 % — ABNORMAL LOW (ref 36.0–46.0)
HEMOGLOBIN: 7.3 g/dL — AB (ref 12.0–15.0)
MCH: 17.1 pg — ABNORMAL LOW (ref 26.0–34.0)
MCHC: 27.8 g/dL — ABNORMAL LOW (ref 30.0–36.0)
MCV: 61.4 fL — ABNORMAL LOW (ref 78.0–100.0)
PLATELETS: 398 10*3/uL (ref 150–400)
RBC: 4.28 MIL/uL (ref 3.87–5.11)
RDW: 20.1 % — ABNORMAL HIGH (ref 11.5–15.5)
WBC: 5.3 10*3/uL (ref 4.0–10.5)

## 2017-07-09 LAB — HEMOGLOBIN AND HEMATOCRIT, BLOOD
HCT: 29.8 % — ABNORMAL LOW (ref 36.0–46.0)
Hemoglobin: 8.7 g/dL — ABNORMAL LOW (ref 12.0–15.0)

## 2017-07-09 LAB — HIV ANTIBODY (ROUTINE TESTING W REFLEX): HIV SCREEN 4TH GENERATION: NONREACTIVE

## 2017-07-09 LAB — ABO/RH: ABO/RH(D): A POS

## 2017-07-09 LAB — PREPARE RBC (CROSSMATCH)

## 2017-07-09 MED ORDER — POLYSACCHARIDE IRON COMPLEX 150 MG PO CAPS: 150.0000 mg | ORAL_CAPSULE | Freq: Two times a day (BID) | ORAL | 1 refills | Status: AC

## 2017-07-09 MED ORDER — POLYSACCHARIDE IRON COMPLEX 150 MG PO CAPS
150.0000 mg | ORAL_CAPSULE | Freq: Two times a day (BID) | ORAL | Status: DC
  Administered 2017-07-09: 150 mg via ORAL
  Filled 2017-07-09: qty 1

## 2017-07-09 MED ORDER — SODIUM CHLORIDE 0.9 % IV SOLN
Freq: Once | INTRAVENOUS | Status: AC
  Administered 2017-07-09: 11:00:00 via INTRAVENOUS

## 2017-07-09 NOTE — Discharge Summary (Signed)
Physician Discharge Summary  KARINGTON Jenkins OHY:[id number] DOB: 15-Jul-1970 DOA: 07/07/2017  PCP: Christine Lair., MD  Admit date: 07/07/2017 Discharge date: 07/09/2017  Time spent: 35 minutes  Recommendations for Outpatient Follow-up:  1. Repeat basic metabolic panel to follow electrolytes and renal function 2. Repeat CBC to follow hemoglobin trend. 3. Arrange outpatient sleep study; patient with most likely complaining of obstructive sleep apnea and obesity hypoventilation syndrome.  Discharge Diagnoses:  Active Problems:   Hyponatremia   Hypokalemia   Hypertension   Hypersomnolence   Altered mental status   Iron deficiency anemia Severe morbid obesity.  Discharge Condition: Stable and improved.  Patient discharged home with instructions to follow-up with PCP as an outpatient and also to follow-up with her neurologist for sleep study.  Diet recommendation: Low calorie diet and heart healthy.  Filed Weights   07/07/17 1707 07/08/17 0130 07/09/17 0500  Weight: 133.8 kg (295 lb) 132.7 kg (292 lb 8.8 oz) 132.4 kg (291 lb 14.2 oz)    History of present illness:  As per H&P written by Christine Jenkins On 07/08/2017 47 y.o. female, with history of chronic pain syndrome, hypertension, CAD, depression was brought to the ED after patient was found to be somnolent at home.  Patient is more alert now and able to provide history.  As per patient she felt drowsy this morning and did not take any extra medications.  She usually takes Nucynta for her pain. She denies taking any other opioids. Denies any symptoms no chest pain or shortness of breath. No fever or chills. Denies dysuria. In the ED,  patient was started on Narcan drip and surprisingly she improved with Narcan drip.  Hospital Course:  1-hypersomnolent/altered mental status -Most likely associated with hypoxemia in the setting of polypharmacy (specifically use of narcotics) -Patient with excellent response to Narcan -Further work-up for  AMS negative. -High concerns for OSA/OHS, patient refused the use of CPAP empirically; will recommend sleep study as an outpatient and usage of CPAP if found in need for that.   2-hyponatremia -Slightly associated with mild dehydration, use of diuretics and also chronic use of antiepileptic drugs. -After IV fluids patient's sodium level up to 128 and she completely asymptomatic -Reviewing her records I have seen sodium levels in the 1/29-1 31, so essentially she is back to her baseline at discharge. -Repeat basic metabolic panel follow-up to reassess electrolytes trend.  3-hypokalemia: Repleted and within normal limits at discharge. -Most likely associated with the use of HCTZ.  4-iron deficiency anemia -In the setting of chronic menorrhagia -Patient received 1 unit of blood transfusion with a significant improvement in her hemoglobin level. -Discharged on Niferex twice a day -CBC at follow-up to reassess hemoglobin trend.  5-chronic pain syndrome -Continue Nucynta And as needed Robaxin. -Patient instructed to be careful with extra dosages to avoid increased somnolence and affectation respiratory drive.  6-depression/anxiety -No suicidal ideation or hallucinations -Mood overall stable -Resume home antidepressant/anxiolytic regimen.  7-morbid obesity -Body mass index is 48.57 kg/m. -Low calorie diet, portion control and increase physical activity discussed with patient  Procedures:  See below for x-ray reports  Consultations:  None  Discharge Exam: Vitals:   07/09/17 1215 07/09/17 1400  BP: 129/85 134/72  Pulse: 84   Resp:  16  Temp: 98.6 F (37 C) 98.5 F (36.9 C)  SpO2:      General: Afebrile, alert, awake and oriented x3, no chest pain, no shortness of breath, denies any acute distress. Cardiovascular: S1 and S2, no  rubs, no gallops, no murmurs, unable to assess JVD secondary to body habitus. Respiratory: No using accessory muscles, good oxygen saturation on  room air, no wheezing, no crackles. Abdomen: Soft, nontender, nondistended, obese, no guarding. Extremities: No edema, no cyanosis, no clubbing.  Discharge Instructions   Discharge Instructions    Discharge instructions   Complete by:  As directed    Take medications as prescribed Keep yourself well-hydrated Follow low calorie heart healthy diet Please make sure to follow-up with Christine Jenkins for sleep study and be compliant with CPAP as an outpatient.     Allergies as of 07/09/2017      Reactions   Buprenorphine Hcl Other (See Comments), Shortness Of Breath, Swelling   Causes bleeding, throat swelling Pt states she has tolerated Oxycodone   Morphine And Related Shortness Of Breath, Swelling, Other (See Comments)   Causes bleeding, throat swelling Pt states she has tolerated Oxycodone   Tape Rash      Medication List    STOP taking these medications   promethazine 50 MG tablet Commonly known as:  PHENERGAN     TAKE these medications   ADVAIR DISKUS 250-50 MCG/DOSE Aepb Generic drug:  Fluticasone-Salmeterol Inhale 1 puff into the lungs 2 (two) times daily.   cetirizine 10 MG tablet Commonly known as:  ZYRTEC Take 10 mg by mouth at bedtime.   diphenhydrAMINE 25 mg capsule Commonly known as:  BENADRYL Take 50 mg by mouth 2 (two) times daily.   DULoxetine 60 MG capsule Commonly known as:  CYMBALTA Take 60 mg by mouth daily.   esomeprazole 40 MG capsule Commonly known as:  NEXIUM Take 40 mg by mouth daily.   fluticasone 50 MCG/ACT nasal spray Commonly known as:  FLONASE Place 1 spray into both nostrils daily as needed for allergies or rhinitis.   iron polysaccharides 150 MG capsule Commonly known as:  NIFEREX Take 1 capsule (150 mg total) by mouth 2 (two) times daily.   lisinopril-hydrochlorothiazide 20-12.5 MG tablet Commonly known as:  PRINZIDE,ZESTORETIC Take 1 tablet by mouth at bedtime.   LORazepam 0.5 MG tablet Commonly known as:  ATIVAN Take 0.5  mg by mouth 3 (three) times daily as needed for anxiety.   methocarbamol 750 MG tablet Commonly known as:  ROBAXIN Take 750 mg by mouth 2 (two) times daily.   NUCYNTA 100 MG Tabs Generic drug:  Tapentadol HCl Take 100 mg by mouth 3 (three) times daily.   oxcarbazepine 600 MG tablet Commonly known as:  TRILEPTAL Take 1,200 mg by mouth 2 (two) times daily.   potassium chloride 10 MEQ tablet Commonly known as:  K-DUR Take 10 mEq by mouth 2 (two) times daily.   pregabalin 300 MG capsule Commonly known as:  LYRICA Take 300 mg by mouth 2 (two) times daily.   simvastatin 40 MG tablet Commonly known as:  ZOCOR Take 40 mg by mouth at bedtime.   SUMAtriptan Succinate 6 MG/0.5ML Soct Inject 0.5 mLs into the skin every 2 (two) hours as needed (for migraine relief-not to exceed 2 doses within 24 hours).   VENTOLIN HFA 108 (90 Base) MCG/ACT inhaler Generic drug:  albuterol Inhale 1-2 puffs into the lungs every 6 (six) hours as needed for wheezing or shortness of breath.      Allergies  Allergen Reactions  . Buprenorphine Hcl Other (See Comments), Shortness Of Breath and Swelling    Causes bleeding, throat swelling Pt states she has tolerated Oxycodone  . Morphine And Related Shortness Of Breath,  Swelling and Other (See Comments)    Causes bleeding, throat swelling Pt states she has tolerated Oxycodone  . Tape Rash   Follow-up Information    Christine Lair., MD. Schedule an appointment as soon as possible for a visit in 14 day(s).   Specialty:  Family Medicine Contact information: [address] [place] [address] [phone]        Phillips Odor, MD. Schedule an appointment as soon as possible for a visit in 1 week(s).   Specialty:  Neurology Contact information: [address] [place] [address] [phone]           The results of significant diagnostics from this hospitalization (including imaging, microbiology, ancillary and laboratory) are  listed below for reference.    Significant Diagnostic Studies: Ct Head Wo Contrast  Result Date: 07/07/2017 CLINICAL DATA:  Patient unable to move her arms and legs. Near syncopal event. EXAM: CT HEAD WITHOUT CONTRAST TECHNIQUE: Contiguous axial images were obtained from the base of the skull through the vertex without intravenous contrast. COMPARISON:  June 08, 2014 FINDINGS: Brain: No evidence of acute infarction, hemorrhage, hydrocephalus, extra-axial collection or mass lesion/mass effect. Vascular: No hyperdense vessel or unexpected calcification. Skull: Normal. Negative for fracture or focal lesion. Sinuses/Orbits: No acute finding. Other: None. IMPRESSION: No cause for the patient's symptoms identified. No acute intracranial abnormality. Electronically Signed   By: Dorise Bullion III M.D   On: 07/07/2017 20:38   Dg Chest Port 1 View  Result Date: 07/07/2017 CLINICAL DATA:  Altered mental status EXAM: PORTABLE CHEST 1 VIEW COMPARISON:  None. FINDINGS: Normal heart size. Lungs clear. No pneumothorax. No pleural effusion. IMPRESSION: No active disease. Electronically Signed   By: Marybelle Killings M.D.   On: 07/07/2017 18:29    Microbiology: Recent Results (from the past 240 hour(s))  MRSA PCR Screening     Status: None   Collection Time: 07/08/17  1:27 AM  Result Value Ref Range Status   MRSA by PCR NEGATIVE NEGATIVE Final    Comment:        The GeneXpert MRSA Assay (FDA approved for NASAL specimens only), is one component of a comprehensive MRSA colonization surveillance program. It is not intended to diagnose MRSA infection nor to guide or monitor treatment for MRSA infections. Performed at [facility], [address], [place], [place] [address]      Labs: Basic Metabolic Panel: Recent Labs  Lab 07/07/17 1811 07/08/17 0529 07/09/17 0355  NA 121* 121* 128*  K 3.3* 3.4* 4.1  CL 87* 88* 94*  CO2 25 25 27   GLUCOSE 91 102* 166*  BUN 10 5* 7  CREATININE 0.58 0.47 0.59   CALCIUM 8.5* 8.5* 8.6*   Liver Function Tests: Recent Labs  Lab 07/07/17 1811 07/08/17 0529  AST 11* 12*  ALT 15 15  ALKPHOS 59 60  BILITOT 0.5 0.4  PROT 7.0 6.6  ALBUMIN 3.6 3.4*   Recent Labs  Lab 07/07/17 1811  LIPASE 24   CBC: Recent Labs  Lab 07/07/17 1811 07/08/17 0529 07/09/17 0355 07/09/17 1430  WBC 8.8 6.0 5.3  --   HGB 7.6* 7.5* 7.3* 8.7*  HCT 26.8* 26.0* 26.3* 29.8*  MCV 59.8* 59.6* 61.4*  --   PLT 399 366 398  --     CBG: Recent Labs  Lab 07/07/17 1723  GLUCAP 92    Signed:  Barton Dubois MD.  [facility] 07/09/2017, 3:36 PM

## 2017-07-10 LAB — TYPE AND SCREEN
ABO/RH(D): A POS
ANTIBODY SCREEN: NEGATIVE
Unit division: 0

## 2017-07-10 LAB — BPAM RBC
BLOOD PRODUCT EXPIRATION DATE: 201906092359
ISSUE DATE / TIME: 201905311156
UNIT TYPE AND RH: 600

## 2017-08-05 DIAGNOSIS — H538 Other visual disturbances: Secondary | ICD-10-CM | POA: Diagnosis not present

## 2017-08-05 DIAGNOSIS — R42 Dizziness and giddiness: Secondary | ICD-10-CM | POA: Diagnosis not present

## 2017-08-05 DIAGNOSIS — E668 Other obesity: Secondary | ICD-10-CM | POA: Diagnosis not present

## 2017-09-03 DIAGNOSIS — R42 Dizziness and giddiness: Secondary | ICD-10-CM | POA: Diagnosis not present

## 2017-09-03 DIAGNOSIS — G4459 Other complicated headache syndrome: Secondary | ICD-10-CM | POA: Diagnosis not present

## 2017-09-03 DIAGNOSIS — G500 Trigeminal neuralgia: Secondary | ICD-10-CM | POA: Diagnosis not present

## 2017-09-03 DIAGNOSIS — Z79891 Long term (current) use of opiate analgesic: Secondary | ICD-10-CM | POA: Diagnosis not present

## 2017-09-03 DIAGNOSIS — M545 Low back pain: Secondary | ICD-10-CM | POA: Diagnosis not present

## 2017-10-15 DIAGNOSIS — G500 Trigeminal neuralgia: Secondary | ICD-10-CM | POA: Diagnosis not present

## 2017-10-15 DIAGNOSIS — G4459 Other complicated headache syndrome: Secondary | ICD-10-CM | POA: Diagnosis not present

## 2017-11-11 DIAGNOSIS — G500 Trigeminal neuralgia: Secondary | ICD-10-CM | POA: Diagnosis not present

## 2017-11-11 DIAGNOSIS — G43701 Chronic migraine without aura, not intractable, with status migrainosus: Secondary | ICD-10-CM | POA: Diagnosis not present

## 2017-11-11 DIAGNOSIS — M545 Low back pain: Secondary | ICD-10-CM | POA: Diagnosis not present

## 2017-11-11 DIAGNOSIS — R42 Dizziness and giddiness: Secondary | ICD-10-CM | POA: Diagnosis not present

## 2017-12-27 DIAGNOSIS — M545 Low back pain: Secondary | ICD-10-CM | POA: Diagnosis not present

## 2017-12-27 DIAGNOSIS — Z79891 Long term (current) use of opiate analgesic: Secondary | ICD-10-CM | POA: Diagnosis not present

## 2017-12-27 DIAGNOSIS — G500 Trigeminal neuralgia: Secondary | ICD-10-CM | POA: Diagnosis not present

## 2017-12-27 DIAGNOSIS — G43701 Chronic migraine without aura, not intractable, with status migrainosus: Secondary | ICD-10-CM | POA: Diagnosis not present

## 2018-01-12 DIAGNOSIS — F17210 Nicotine dependence, cigarettes, uncomplicated: Secondary | ICD-10-CM | POA: Diagnosis not present

## 2018-01-12 DIAGNOSIS — E669 Obesity, unspecified: Secondary | ICD-10-CM | POA: Diagnosis not present

## 2018-01-12 DIAGNOSIS — G8929 Other chronic pain: Secondary | ICD-10-CM | POA: Diagnosis not present

## 2018-01-12 DIAGNOSIS — I10 Essential (primary) hypertension: Secondary | ICD-10-CM | POA: Diagnosis not present

## 2018-01-12 DIAGNOSIS — Z7982 Long term (current) use of aspirin: Secondary | ICD-10-CM | POA: Diagnosis not present

## 2018-01-12 DIAGNOSIS — C719 Malignant neoplasm of brain, unspecified: Secondary | ICD-10-CM | POA: Diagnosis not present

## 2018-01-12 DIAGNOSIS — T7840XA Allergy, unspecified, initial encounter: Secondary | ICD-10-CM | POA: Diagnosis not present

## 2018-01-12 DIAGNOSIS — Z885 Allergy status to narcotic agent status: Secondary | ICD-10-CM | POA: Diagnosis not present

## 2018-01-12 DIAGNOSIS — E871 Hypo-osmolality and hyponatremia: Secondary | ICD-10-CM | POA: Diagnosis not present

## 2018-01-12 DIAGNOSIS — Z79899 Other long term (current) drug therapy: Secondary | ICD-10-CM | POA: Diagnosis not present

## 2018-01-12 DIAGNOSIS — R062 Wheezing: Secondary | ICD-10-CM | POA: Diagnosis not present

## 2018-01-12 DIAGNOSIS — D500 Iron deficiency anemia secondary to blood loss (chronic): Secondary | ICD-10-CM | POA: Diagnosis not present

## 2018-01-31 DIAGNOSIS — G500 Trigeminal neuralgia: Secondary | ICD-10-CM | POA: Diagnosis not present

## 2018-01-31 DIAGNOSIS — G43701 Chronic migraine without aura, not intractable, with status migrainosus: Secondary | ICD-10-CM | POA: Diagnosis not present

## 2018-01-31 DIAGNOSIS — Z79891 Long term (current) use of opiate analgesic: Secondary | ICD-10-CM | POA: Diagnosis not present

## 2018-01-31 DIAGNOSIS — R42 Dizziness and giddiness: Secondary | ICD-10-CM | POA: Diagnosis not present

## 2018-02-28 DIAGNOSIS — Z79891 Long term (current) use of opiate analgesic: Secondary | ICD-10-CM | POA: Diagnosis not present

## 2018-02-28 DIAGNOSIS — R42 Dizziness and giddiness: Secondary | ICD-10-CM | POA: Diagnosis not present

## 2018-02-28 DIAGNOSIS — H538 Other visual disturbances: Secondary | ICD-10-CM | POA: Diagnosis not present

## 2018-02-28 DIAGNOSIS — R569 Unspecified convulsions: Secondary | ICD-10-CM | POA: Diagnosis not present

## 2018-02-28 DIAGNOSIS — G43701 Chronic migraine without aura, not intractable, with status migrainosus: Secondary | ICD-10-CM | POA: Diagnosis not present

## 2018-02-28 DIAGNOSIS — M545 Low back pain: Secondary | ICD-10-CM | POA: Diagnosis not present

## 2018-03-18 DIAGNOSIS — G500 Trigeminal neuralgia: Secondary | ICD-10-CM | POA: Diagnosis not present

## 2018-03-18 DIAGNOSIS — G4459 Other complicated headache syndrome: Secondary | ICD-10-CM | POA: Diagnosis not present

## 2018-03-28 DIAGNOSIS — R569 Unspecified convulsions: Secondary | ICD-10-CM | POA: Diagnosis not present

## 2018-03-28 DIAGNOSIS — G43701 Chronic migraine without aura, not intractable, with status migrainosus: Secondary | ICD-10-CM | POA: Diagnosis not present

## 2018-03-28 DIAGNOSIS — G4459 Other complicated headache syndrome: Secondary | ICD-10-CM | POA: Diagnosis not present

## 2018-03-28 DIAGNOSIS — R42 Dizziness and giddiness: Secondary | ICD-10-CM | POA: Diagnosis not present

## 2018-03-28 DIAGNOSIS — M545 Low back pain: Secondary | ICD-10-CM | POA: Diagnosis not present

## 2018-03-28 DIAGNOSIS — Z79891 Long term (current) use of opiate analgesic: Secondary | ICD-10-CM | POA: Diagnosis not present

## 2018-04-25 DIAGNOSIS — Z79891 Long term (current) use of opiate analgesic: Secondary | ICD-10-CM | POA: Diagnosis not present

## 2018-04-25 DIAGNOSIS — E668 Other obesity: Secondary | ICD-10-CM | POA: Diagnosis not present

## 2018-04-25 DIAGNOSIS — G500 Trigeminal neuralgia: Secondary | ICD-10-CM | POA: Diagnosis not present

## 2018-04-25 DIAGNOSIS — G43701 Chronic migraine without aura, not intractable, with status migrainosus: Secondary | ICD-10-CM | POA: Diagnosis not present

## 2018-04-25 DIAGNOSIS — M545 Low back pain: Secondary | ICD-10-CM | POA: Diagnosis not present

## 2018-05-23 DIAGNOSIS — G43701 Chronic migraine without aura, not intractable, with status migrainosus: Secondary | ICD-10-CM | POA: Diagnosis not present

## 2018-05-23 DIAGNOSIS — G500 Trigeminal neuralgia: Secondary | ICD-10-CM | POA: Diagnosis not present

## 2018-05-23 DIAGNOSIS — G4459 Other complicated headache syndrome: Secondary | ICD-10-CM | POA: Diagnosis not present

## 2018-05-23 DIAGNOSIS — E668 Other obesity: Secondary | ICD-10-CM | POA: Diagnosis not present

## 2018-06-20 DIAGNOSIS — G4459 Other complicated headache syndrome: Secondary | ICD-10-CM | POA: Diagnosis not present

## 2018-06-20 DIAGNOSIS — Z79891 Long term (current) use of opiate analgesic: Secondary | ICD-10-CM | POA: Diagnosis not present

## 2018-06-20 DIAGNOSIS — G43701 Chronic migraine without aura, not intractable, with status migrainosus: Secondary | ICD-10-CM | POA: Diagnosis not present

## 2018-06-20 DIAGNOSIS — M545 Low back pain: Secondary | ICD-10-CM | POA: Diagnosis not present

## 2018-06-20 DIAGNOSIS — G500 Trigeminal neuralgia: Secondary | ICD-10-CM | POA: Diagnosis not present

## 2018-06-20 DIAGNOSIS — E668 Other obesity: Secondary | ICD-10-CM | POA: Diagnosis not present

## 2018-07-18 DIAGNOSIS — M545 Low back pain: Secondary | ICD-10-CM | POA: Diagnosis not present

## 2018-07-18 DIAGNOSIS — G500 Trigeminal neuralgia: Secondary | ICD-10-CM | POA: Diagnosis not present

## 2018-07-18 DIAGNOSIS — G43701 Chronic migraine without aura, not intractable, with status migrainosus: Secondary | ICD-10-CM | POA: Diagnosis not present

## 2018-07-18 DIAGNOSIS — Z79891 Long term (current) use of opiate analgesic: Secondary | ICD-10-CM | POA: Diagnosis not present

## 2018-08-19 DIAGNOSIS — G4459 Other complicated headache syndrome: Secondary | ICD-10-CM | POA: Diagnosis not present

## 2018-08-19 DIAGNOSIS — G500 Trigeminal neuralgia: Secondary | ICD-10-CM | POA: Diagnosis not present

## 2018-09-19 DIAGNOSIS — G500 Trigeminal neuralgia: Secondary | ICD-10-CM | POA: Diagnosis not present

## 2018-09-19 DIAGNOSIS — Z79891 Long term (current) use of opiate analgesic: Secondary | ICD-10-CM | POA: Diagnosis not present

## 2018-09-19 DIAGNOSIS — G43701 Chronic migraine without aura, not intractable, with status migrainosus: Secondary | ICD-10-CM | POA: Diagnosis not present

## 2018-09-19 DIAGNOSIS — M545 Low back pain: Secondary | ICD-10-CM | POA: Diagnosis not present

## 2018-10-07 ENCOUNTER — Other Ambulatory Visit: Payer: Self-pay | Admitting: Family Medicine

## 2018-10-12 ENCOUNTER — Other Ambulatory Visit: Payer: Self-pay | Admitting: Family Medicine

## 2018-11-10 DIAGNOSIS — E668 Other obesity: Secondary | ICD-10-CM | POA: Diagnosis not present

## 2018-11-10 DIAGNOSIS — M545 Low back pain: Secondary | ICD-10-CM | POA: Diagnosis not present

## 2018-11-10 DIAGNOSIS — G43701 Chronic migraine without aura, not intractable, with status migrainosus: Secondary | ICD-10-CM | POA: Diagnosis not present

## 2018-11-10 DIAGNOSIS — G500 Trigeminal neuralgia: Secondary | ICD-10-CM | POA: Diagnosis not present

## 2018-11-10 DIAGNOSIS — Z79891 Long term (current) use of opiate analgesic: Secondary | ICD-10-CM | POA: Diagnosis not present

## 2018-11-10 DIAGNOSIS — G4459 Other complicated headache syndrome: Secondary | ICD-10-CM | POA: Diagnosis not present

## 2019-01-02 DIAGNOSIS — E668 Other obesity: Secondary | ICD-10-CM | POA: Diagnosis not present

## 2019-01-02 DIAGNOSIS — M545 Low back pain: Secondary | ICD-10-CM | POA: Diagnosis not present

## 2019-01-02 DIAGNOSIS — G4459 Other complicated headache syndrome: Secondary | ICD-10-CM | POA: Diagnosis not present

## 2019-01-02 DIAGNOSIS — G43701 Chronic migraine without aura, not intractable, with status migrainosus: Secondary | ICD-10-CM | POA: Diagnosis not present

## 2019-01-02 DIAGNOSIS — G500 Trigeminal neuralgia: Secondary | ICD-10-CM | POA: Diagnosis not present

## 2019-01-02 DIAGNOSIS — Z79891 Long term (current) use of opiate analgesic: Secondary | ICD-10-CM | POA: Diagnosis not present

## 2019-04-13 DIAGNOSIS — Z79891 Long term (current) use of opiate analgesic: Secondary | ICD-10-CM | POA: Diagnosis not present

## 2019-04-13 DIAGNOSIS — M545 Low back pain: Secondary | ICD-10-CM | POA: Diagnosis not present

## 2019-04-13 DIAGNOSIS — G43701 Chronic migraine without aura, not intractable, with status migrainosus: Secondary | ICD-10-CM | POA: Diagnosis not present

## 2019-04-13 DIAGNOSIS — G500 Trigeminal neuralgia: Secondary | ICD-10-CM | POA: Diagnosis not present

## 2019-04-13 DIAGNOSIS — E669 Obesity, unspecified: Secondary | ICD-10-CM | POA: Diagnosis not present

## 2019-06-15 DIAGNOSIS — J329 Chronic sinusitis, unspecified: Secondary | ICD-10-CM | POA: Diagnosis not present

## 2019-06-15 DIAGNOSIS — M545 Low back pain: Secondary | ICD-10-CM | POA: Diagnosis not present

## 2019-06-15 DIAGNOSIS — Z79891 Long term (current) use of opiate analgesic: Secondary | ICD-10-CM | POA: Diagnosis not present

## 2019-06-15 DIAGNOSIS — K219 Gastro-esophageal reflux disease without esophagitis: Secondary | ICD-10-CM | POA: Diagnosis not present

## 2019-06-15 DIAGNOSIS — G4489 Other headache syndrome: Secondary | ICD-10-CM | POA: Diagnosis not present

## 2019-06-15 DIAGNOSIS — J309 Allergic rhinitis, unspecified: Secondary | ICD-10-CM | POA: Diagnosis not present

## 2019-06-15 DIAGNOSIS — G43711 Chronic migraine without aura, intractable, with status migrainosus: Secondary | ICD-10-CM | POA: Diagnosis not present

## 2019-06-15 DIAGNOSIS — G500 Trigeminal neuralgia: Secondary | ICD-10-CM | POA: Diagnosis not present

## 2019-06-15 DIAGNOSIS — I10 Essential (primary) hypertension: Secondary | ICD-10-CM | POA: Diagnosis not present

## 2019-06-15 DIAGNOSIS — E876 Hypokalemia: Secondary | ICD-10-CM | POA: Diagnosis not present

## 2019-06-15 DIAGNOSIS — E668 Other obesity: Secondary | ICD-10-CM | POA: Diagnosis not present

## 2019-07-16 DIAGNOSIS — K565 Intestinal adhesions [bands], unspecified as to partial versus complete obstruction: Secondary | ICD-10-CM | POA: Diagnosis not present

## 2019-07-16 DIAGNOSIS — J449 Chronic obstructive pulmonary disease, unspecified: Secondary | ICD-10-CM | POA: Diagnosis present

## 2019-07-16 DIAGNOSIS — K659 Peritonitis, unspecified: Secondary | ICD-10-CM | POA: Diagnosis present

## 2019-07-16 DIAGNOSIS — I10 Essential (primary) hypertension: Secondary | ICD-10-CM | POA: Diagnosis present

## 2019-07-16 DIAGNOSIS — R0600 Dyspnea, unspecified: Secondary | ICD-10-CM | POA: Diagnosis not present

## 2019-07-16 DIAGNOSIS — Z7982 Long term (current) use of aspirin: Secondary | ICD-10-CM | POA: Diagnosis not present

## 2019-07-16 DIAGNOSIS — I2510 Atherosclerotic heart disease of native coronary artery without angina pectoris: Secondary | ICD-10-CM | POA: Diagnosis present

## 2019-07-16 DIAGNOSIS — G8929 Other chronic pain: Secondary | ICD-10-CM | POA: Diagnosis present

## 2019-07-16 DIAGNOSIS — K219 Gastro-esophageal reflux disease without esophagitis: Secondary | ICD-10-CM | POA: Diagnosis present

## 2019-07-16 DIAGNOSIS — R0902 Hypoxemia: Secondary | ICD-10-CM | POA: Diagnosis not present

## 2019-07-16 DIAGNOSIS — N83202 Unspecified ovarian cyst, left side: Secondary | ICD-10-CM | POA: Diagnosis not present

## 2019-07-16 DIAGNOSIS — N179 Acute kidney failure, unspecified: Secondary | ICD-10-CM | POA: Diagnosis present

## 2019-07-16 DIAGNOSIS — N7092 Oophoritis, unspecified: Secondary | ICD-10-CM | POA: Diagnosis present

## 2019-07-16 DIAGNOSIS — K56609 Unspecified intestinal obstruction, unspecified as to partial versus complete obstruction: Secondary | ICD-10-CM | POA: Diagnosis not present

## 2019-07-16 DIAGNOSIS — F418 Other specified anxiety disorders: Secondary | ICD-10-CM | POA: Diagnosis present

## 2019-07-16 DIAGNOSIS — R1084 Generalized abdominal pain: Secondary | ICD-10-CM | POA: Diagnosis not present

## 2019-07-16 DIAGNOSIS — E872 Acidosis: Secondary | ICD-10-CM | POA: Diagnosis present

## 2019-07-16 DIAGNOSIS — K567 Ileus, unspecified: Secondary | ICD-10-CM | POA: Diagnosis not present

## 2019-07-16 DIAGNOSIS — G4733 Obstructive sleep apnea (adult) (pediatric): Secondary | ICD-10-CM | POA: Diagnosis present

## 2019-07-16 DIAGNOSIS — Z885 Allergy status to narcotic agent status: Secondary | ICD-10-CM | POA: Diagnosis not present

## 2019-07-16 DIAGNOSIS — E7800 Pure hypercholesterolemia, unspecified: Secondary | ICD-10-CM | POA: Diagnosis present

## 2019-07-16 DIAGNOSIS — D500 Iron deficiency anemia secondary to blood loss (chronic): Secondary | ICD-10-CM | POA: Diagnosis present

## 2019-07-16 DIAGNOSIS — Z7951 Long term (current) use of inhaled steroids: Secondary | ICD-10-CM | POA: Diagnosis not present

## 2019-07-16 DIAGNOSIS — I252 Old myocardial infarction: Secondary | ICD-10-CM | POA: Diagnosis not present

## 2019-07-16 DIAGNOSIS — Z6841 Body Mass Index (BMI) 40.0 and over, adult: Secondary | ICD-10-CM | POA: Diagnosis not present

## 2019-07-16 DIAGNOSIS — F17210 Nicotine dependence, cigarettes, uncomplicated: Secondary | ICD-10-CM | POA: Diagnosis present

## 2019-07-16 DIAGNOSIS — Z8739 Personal history of other diseases of the musculoskeletal system and connective tissue: Secondary | ICD-10-CM | POA: Diagnosis not present

## 2019-07-16 DIAGNOSIS — Z20822 Contact with and (suspected) exposure to covid-19: Secondary | ICD-10-CM | POA: Diagnosis present

## 2019-07-16 DIAGNOSIS — R1114 Bilious vomiting: Secondary | ICD-10-CM | POA: Diagnosis not present

## 2019-07-16 DIAGNOSIS — K828 Other specified diseases of gallbladder: Secondary | ICD-10-CM | POA: Diagnosis not present

## 2019-07-16 DIAGNOSIS — R7402 Elevation of levels of lactic acid dehydrogenase (LDH): Secondary | ICD-10-CM | POA: Diagnosis present

## 2019-07-16 DIAGNOSIS — N739 Female pelvic inflammatory disease, unspecified: Secondary | ICD-10-CM | POA: Diagnosis not present

## 2019-07-26 DIAGNOSIS — I10 Essential (primary) hypertension: Secondary | ICD-10-CM | POA: Diagnosis not present

## 2019-07-26 DIAGNOSIS — E7849 Other hyperlipidemia: Secondary | ICD-10-CM | POA: Diagnosis not present

## 2019-07-26 DIAGNOSIS — F330 Major depressive disorder, recurrent, mild: Secondary | ICD-10-CM | POA: Diagnosis not present

## 2019-07-26 DIAGNOSIS — J449 Chronic obstructive pulmonary disease, unspecified: Secondary | ICD-10-CM | POA: Diagnosis not present

## 2019-08-01 DIAGNOSIS — R0902 Hypoxemia: Secondary | ICD-10-CM | POA: Diagnosis not present

## 2019-08-01 DIAGNOSIS — T8131XA Disruption of external operation (surgical) wound, not elsewhere classified, initial encounter: Secondary | ICD-10-CM | POA: Diagnosis not present

## 2019-08-01 DIAGNOSIS — D500 Iron deficiency anemia secondary to blood loss (chronic): Secondary | ICD-10-CM | POA: Diagnosis not present

## 2019-08-03 ENCOUNTER — Other Ambulatory Visit: Payer: Self-pay | Admitting: *Deleted

## 2019-08-03 NOTE — Patient Outreach (Signed)
Screened for potential [facility] Care Management needs as a benefit of  [employer].  Ms. Stangler is currently receiving skilled therapy at [facility].  Writer attended telephonic interdisciplinary team meeting to assess for disposition needs and transition plan for resident.   Facility reports member is participating with therapy. However, not adhering to wearing abdominal binder. Requires abdominal wound care. Likely dc next week.   Will plan outreach to discuss [place] Management follow up.    Marthenia Rolling, MSN-Ed, RN,BSN [place] Acute Care Coordinator [phone] [facility]) [phone]  (Toll free office)

## 2019-08-09 ENCOUNTER — Other Ambulatory Visit: Payer: Self-pay | Admitting: *Deleted

## 2019-08-09 NOTE — Patient Outreach (Signed)
[facility] Post- Acute Care Coordinator follow up  Update received from [facility]. Reports member will transition to home with her daughter Glade Stanford [phone]. Ms. Quilter will have [facility] for nursing, PT, OT.  Telephone call made to Ms. Lavina Hamman [phone]. Phone not in service. Telephone call made to Glade Stanford (daughter) [phone]. Patient identifiers confirmed. [place] reports member has a new phone but it is not on currently.  [place] endorses Ms. Blacksher will stay with her post SNF. She is unsure of who member's PCP is. States Dr. Scotty Court has since retired and member is set up with a new doctor. Verified with facility SW that new PCP is Dr. Huel Cote with [facility]. Does not appear Ms. Nehring has a [facility] PCP. Therefore Probation officer will not refer to [place] Management.  Marthenia Rolling, MSN-Ed, RN,BSN [place] Acute Care Coordinator [phone] [facility]) [phone]  (Toll free office)

## 2019-08-11 DIAGNOSIS — Z7982 Long term (current) use of aspirin: Secondary | ICD-10-CM | POA: Diagnosis not present

## 2019-08-11 DIAGNOSIS — F329 Major depressive disorder, single episode, unspecified: Secondary | ICD-10-CM | POA: Diagnosis not present

## 2019-08-11 DIAGNOSIS — E7800 Pure hypercholesterolemia, unspecified: Secondary | ICD-10-CM | POA: Diagnosis not present

## 2019-08-11 DIAGNOSIS — Z885 Allergy status to narcotic agent status: Secondary | ICD-10-CM | POA: Diagnosis not present

## 2019-08-11 DIAGNOSIS — F419 Anxiety disorder, unspecified: Secondary | ICD-10-CM | POA: Diagnosis not present

## 2019-08-11 DIAGNOSIS — N739 Female pelvic inflammatory disease, unspecified: Secondary | ICD-10-CM | POA: Diagnosis not present

## 2019-08-11 DIAGNOSIS — I10 Essential (primary) hypertension: Secondary | ICD-10-CM | POA: Diagnosis not present

## 2019-08-11 DIAGNOSIS — I2510 Atherosclerotic heart disease of native coronary artery without angina pectoris: Secondary | ICD-10-CM | POA: Diagnosis not present

## 2019-08-11 DIAGNOSIS — G4733 Obstructive sleep apnea (adult) (pediatric): Secondary | ICD-10-CM | POA: Diagnosis not present

## 2019-08-11 DIAGNOSIS — J449 Chronic obstructive pulmonary disease, unspecified: Secondary | ICD-10-CM | POA: Diagnosis not present

## 2019-08-11 DIAGNOSIS — T8131XA Disruption of external operation (surgical) wound, not elsewhere classified, initial encounter: Secondary | ICD-10-CM | POA: Diagnosis not present

## 2019-08-11 DIAGNOSIS — Z6841 Body Mass Index (BMI) 40.0 and over, adult: Secondary | ICD-10-CM | POA: Diagnosis not present

## 2019-08-11 DIAGNOSIS — I252 Old myocardial infarction: Secondary | ICD-10-CM | POA: Diagnosis not present

## 2019-08-11 DIAGNOSIS — Z79899 Other long term (current) drug therapy: Secondary | ICD-10-CM | POA: Diagnosis not present

## 2019-08-12 DIAGNOSIS — I10 Essential (primary) hypertension: Secondary | ICD-10-CM | POA: Diagnosis not present

## 2019-08-12 DIAGNOSIS — K219 Gastro-esophageal reflux disease without esophagitis: Secondary | ICD-10-CM | POA: Diagnosis not present

## 2019-08-12 DIAGNOSIS — F418 Other specified anxiety disorders: Secondary | ICD-10-CM | POA: Diagnosis not present

## 2019-08-12 DIAGNOSIS — I252 Old myocardial infarction: Secondary | ICD-10-CM | POA: Diagnosis not present

## 2019-08-12 DIAGNOSIS — G4733 Obstructive sleep apnea (adult) (pediatric): Secondary | ICD-10-CM | POA: Diagnosis not present

## 2019-08-12 DIAGNOSIS — J449 Chronic obstructive pulmonary disease, unspecified: Secondary | ICD-10-CM | POA: Diagnosis not present

## 2019-08-12 DIAGNOSIS — E7800 Pure hypercholesterolemia, unspecified: Secondary | ICD-10-CM | POA: Diagnosis not present

## 2019-08-12 DIAGNOSIS — F17210 Nicotine dependence, cigarettes, uncomplicated: Secondary | ICD-10-CM | POA: Diagnosis not present

## 2019-08-12 DIAGNOSIS — T8132XD Disruption of internal operation (surgical) wound, not elsewhere classified, subsequent encounter: Secondary | ICD-10-CM | POA: Diagnosis not present

## 2019-08-12 DIAGNOSIS — Z7982 Long term (current) use of aspirin: Secondary | ICD-10-CM | POA: Diagnosis not present

## 2019-08-12 DIAGNOSIS — I2510 Atherosclerotic heart disease of native coronary artery without angina pectoris: Secondary | ICD-10-CM | POA: Diagnosis not present

## 2019-08-12 DIAGNOSIS — G44099 Other trigeminal autonomic cephalgias (TAC), not intractable: Secondary | ICD-10-CM | POA: Diagnosis not present

## 2019-08-12 DIAGNOSIS — G8929 Other chronic pain: Secondary | ICD-10-CM | POA: Diagnosis not present

## 2019-08-12 DIAGNOSIS — K659 Peritonitis, unspecified: Secondary | ICD-10-CM | POA: Diagnosis not present

## 2019-08-12 DIAGNOSIS — Z6841 Body Mass Index (BMI) 40.0 and over, adult: Secondary | ICD-10-CM | POA: Diagnosis not present

## 2019-08-12 DIAGNOSIS — G629 Polyneuropathy, unspecified: Secondary | ICD-10-CM | POA: Diagnosis not present

## 2019-08-12 DIAGNOSIS — D500 Iron deficiency anemia secondary to blood loss (chronic): Secondary | ICD-10-CM | POA: Diagnosis not present

## 2019-08-14 DIAGNOSIS — G8929 Other chronic pain: Secondary | ICD-10-CM | POA: Diagnosis not present

## 2019-08-14 DIAGNOSIS — K659 Peritonitis, unspecified: Secondary | ICD-10-CM | POA: Diagnosis not present

## 2019-08-14 DIAGNOSIS — I252 Old myocardial infarction: Secondary | ICD-10-CM | POA: Diagnosis not present

## 2019-08-14 DIAGNOSIS — T8132XD Disruption of internal operation (surgical) wound, not elsewhere classified, subsequent encounter: Secondary | ICD-10-CM | POA: Diagnosis not present

## 2019-08-14 DIAGNOSIS — J449 Chronic obstructive pulmonary disease, unspecified: Secondary | ICD-10-CM | POA: Diagnosis not present

## 2019-08-14 DIAGNOSIS — I2510 Atherosclerotic heart disease of native coronary artery without angina pectoris: Secondary | ICD-10-CM | POA: Diagnosis not present

## 2019-08-16 ENCOUNTER — Other Ambulatory Visit: Payer: Self-pay | Admitting: *Deleted

## 2019-08-16 DIAGNOSIS — J449 Chronic obstructive pulmonary disease, unspecified: Secondary | ICD-10-CM | POA: Diagnosis not present

## 2019-08-16 DIAGNOSIS — I252 Old myocardial infarction: Secondary | ICD-10-CM | POA: Diagnosis not present

## 2019-08-16 DIAGNOSIS — K659 Peritonitis, unspecified: Secondary | ICD-10-CM | POA: Diagnosis not present

## 2019-08-16 DIAGNOSIS — I2510 Atherosclerotic heart disease of native coronary artery without angina pectoris: Secondary | ICD-10-CM | POA: Diagnosis not present

## 2019-08-16 DIAGNOSIS — G8929 Other chronic pain: Secondary | ICD-10-CM | POA: Diagnosis not present

## 2019-08-16 DIAGNOSIS — T8132XD Disruption of internal operation (surgical) wound, not elsewhere classified, subsequent encounter: Secondary | ICD-10-CM | POA: Diagnosis not present

## 2019-08-16 NOTE — Patient Outreach (Signed)
[place] Coordinator follow up  Ms. Mcwethy transitioned to home from [facility] on 08[facility]10/19 with home health.  Telephone call made to daughter Jordan Hawks [phone] to speak with Ms. Mcneece. No answer. HIPAA compliant voicemail message left to request return call. Daughter previously advised Probation officer to contact member on her cell phone since Ms. Seeman mobile was not working.  Marthenia Rolling, MSN-Ed, RN,BSN [place] Acute Care Coordinator [phone] [facility]) [phone]  (Toll free office)

## 2019-08-16 NOTE — Patient Outreach (Signed)
[facility]- Acute Care Coordinator follow up  Return call from [facility]. Patient identifiers confirmed. Christine Jenkins reports her new mobile number is [phone].   Christine Jenkins states she is doing okay. States [facility] RN has been out to see her. States her wound is looking better. Christine Jenkins is staying with her daughter Christine Jenkins.  Christine Jenkins states she will have follow up appointment with her new MD at [facility] in [place]. Her appointment is 08/23/19. Dr. Huel Cote is not a [facility] Provider. Therefore, [facility] Care Management unable to follow at this time.   Christine Jenkins expressed appreciation of writer's outreach.    Christine Rolling, MSN-Ed, RN,BSN [place] Acute Care Coordinator [phone] [facility]) [phone]  (Toll free office)

## 2019-08-18 DIAGNOSIS — G8929 Other chronic pain: Secondary | ICD-10-CM | POA: Diagnosis not present

## 2019-08-18 DIAGNOSIS — I252 Old myocardial infarction: Secondary | ICD-10-CM | POA: Diagnosis not present

## 2019-08-18 DIAGNOSIS — J449 Chronic obstructive pulmonary disease, unspecified: Secondary | ICD-10-CM | POA: Diagnosis not present

## 2019-08-18 DIAGNOSIS — T8132XD Disruption of internal operation (surgical) wound, not elsewhere classified, subsequent encounter: Secondary | ICD-10-CM | POA: Diagnosis not present

## 2019-08-18 DIAGNOSIS — K659 Peritonitis, unspecified: Secondary | ICD-10-CM | POA: Diagnosis not present

## 2019-08-18 DIAGNOSIS — I2510 Atherosclerotic heart disease of native coronary artery without angina pectoris: Secondary | ICD-10-CM | POA: Diagnosis not present

## 2019-08-21 DIAGNOSIS — I252 Old myocardial infarction: Secondary | ICD-10-CM | POA: Diagnosis not present

## 2019-08-21 DIAGNOSIS — T8132XD Disruption of internal operation (surgical) wound, not elsewhere classified, subsequent encounter: Secondary | ICD-10-CM | POA: Diagnosis not present

## 2019-08-21 DIAGNOSIS — G8929 Other chronic pain: Secondary | ICD-10-CM | POA: Diagnosis not present

## 2019-08-21 DIAGNOSIS — K659 Peritonitis, unspecified: Secondary | ICD-10-CM | POA: Diagnosis not present

## 2019-08-21 DIAGNOSIS — J449 Chronic obstructive pulmonary disease, unspecified: Secondary | ICD-10-CM | POA: Diagnosis not present

## 2019-08-21 DIAGNOSIS — I2510 Atherosclerotic heart disease of native coronary artery without angina pectoris: Secondary | ICD-10-CM | POA: Diagnosis not present

## 2019-08-23 DIAGNOSIS — I252 Old myocardial infarction: Secondary | ICD-10-CM | POA: Diagnosis not present

## 2019-08-23 DIAGNOSIS — T8132XD Disruption of internal operation (surgical) wound, not elsewhere classified, subsequent encounter: Secondary | ICD-10-CM | POA: Diagnosis not present

## 2019-08-23 DIAGNOSIS — G8929 Other chronic pain: Secondary | ICD-10-CM | POA: Diagnosis not present

## 2019-08-23 DIAGNOSIS — K659 Peritonitis, unspecified: Secondary | ICD-10-CM | POA: Diagnosis not present

## 2019-08-23 DIAGNOSIS — J449 Chronic obstructive pulmonary disease, unspecified: Secondary | ICD-10-CM | POA: Diagnosis not present

## 2019-08-23 DIAGNOSIS — I2510 Atherosclerotic heart disease of native coronary artery without angina pectoris: Secondary | ICD-10-CM | POA: Diagnosis not present

## 2019-08-26 DIAGNOSIS — T8132XD Disruption of internal operation (surgical) wound, not elsewhere classified, subsequent encounter: Secondary | ICD-10-CM | POA: Diagnosis not present

## 2019-08-26 DIAGNOSIS — I2510 Atherosclerotic heart disease of native coronary artery without angina pectoris: Secondary | ICD-10-CM | POA: Diagnosis not present

## 2019-08-26 DIAGNOSIS — K659 Peritonitis, unspecified: Secondary | ICD-10-CM | POA: Diagnosis not present

## 2019-08-26 DIAGNOSIS — J449 Chronic obstructive pulmonary disease, unspecified: Secondary | ICD-10-CM | POA: Diagnosis not present

## 2019-08-26 DIAGNOSIS — G8929 Other chronic pain: Secondary | ICD-10-CM | POA: Diagnosis not present

## 2019-08-26 DIAGNOSIS — I252 Old myocardial infarction: Secondary | ICD-10-CM | POA: Diagnosis not present

## 2019-08-28 DIAGNOSIS — J449 Chronic obstructive pulmonary disease, unspecified: Secondary | ICD-10-CM | POA: Diagnosis not present

## 2019-08-28 DIAGNOSIS — I252 Old myocardial infarction: Secondary | ICD-10-CM | POA: Diagnosis not present

## 2019-08-28 DIAGNOSIS — T8132XD Disruption of internal operation (surgical) wound, not elsewhere classified, subsequent encounter: Secondary | ICD-10-CM | POA: Diagnosis not present

## 2019-08-28 DIAGNOSIS — K659 Peritonitis, unspecified: Secondary | ICD-10-CM | POA: Diagnosis not present

## 2019-08-28 DIAGNOSIS — I2510 Atherosclerotic heart disease of native coronary artery without angina pectoris: Secondary | ICD-10-CM | POA: Diagnosis not present

## 2019-08-28 DIAGNOSIS — G8929 Other chronic pain: Secondary | ICD-10-CM | POA: Diagnosis not present

## 2019-08-29 DIAGNOSIS — K659 Peritonitis, unspecified: Secondary | ICD-10-CM | POA: Diagnosis not present

## 2019-08-29 DIAGNOSIS — G8929 Other chronic pain: Secondary | ICD-10-CM | POA: Diagnosis not present

## 2019-08-29 DIAGNOSIS — I252 Old myocardial infarction: Secondary | ICD-10-CM | POA: Diagnosis not present

## 2019-08-29 DIAGNOSIS — J449 Chronic obstructive pulmonary disease, unspecified: Secondary | ICD-10-CM | POA: Diagnosis not present

## 2019-08-29 DIAGNOSIS — T8132XD Disruption of internal operation (surgical) wound, not elsewhere classified, subsequent encounter: Secondary | ICD-10-CM | POA: Diagnosis not present

## 2019-08-29 DIAGNOSIS — I2510 Atherosclerotic heart disease of native coronary artery without angina pectoris: Secondary | ICD-10-CM | POA: Diagnosis not present

## 2019-08-30 DIAGNOSIS — K659 Peritonitis, unspecified: Secondary | ICD-10-CM | POA: Diagnosis not present

## 2019-08-30 DIAGNOSIS — I252 Old myocardial infarction: Secondary | ICD-10-CM | POA: Diagnosis not present

## 2019-08-30 DIAGNOSIS — G8929 Other chronic pain: Secondary | ICD-10-CM | POA: Diagnosis not present

## 2019-08-30 DIAGNOSIS — I2510 Atherosclerotic heart disease of native coronary artery without angina pectoris: Secondary | ICD-10-CM | POA: Diagnosis not present

## 2019-08-30 DIAGNOSIS — J449 Chronic obstructive pulmonary disease, unspecified: Secondary | ICD-10-CM | POA: Diagnosis not present

## 2019-08-30 DIAGNOSIS — T8132XD Disruption of internal operation (surgical) wound, not elsewhere classified, subsequent encounter: Secondary | ICD-10-CM | POA: Diagnosis not present

## 2019-09-05 DIAGNOSIS — G8929 Other chronic pain: Secondary | ICD-10-CM | POA: Diagnosis not present

## 2019-09-05 DIAGNOSIS — J449 Chronic obstructive pulmonary disease, unspecified: Secondary | ICD-10-CM | POA: Diagnosis not present

## 2019-09-05 DIAGNOSIS — K659 Peritonitis, unspecified: Secondary | ICD-10-CM | POA: Diagnosis not present

## 2019-09-05 DIAGNOSIS — T8132XD Disruption of internal operation (surgical) wound, not elsewhere classified, subsequent encounter: Secondary | ICD-10-CM | POA: Diagnosis not present

## 2019-09-05 DIAGNOSIS — I252 Old myocardial infarction: Secondary | ICD-10-CM | POA: Diagnosis not present

## 2019-09-05 DIAGNOSIS — I2510 Atherosclerotic heart disease of native coronary artery without angina pectoris: Secondary | ICD-10-CM | POA: Diagnosis not present

## 2019-09-07 DIAGNOSIS — G8929 Other chronic pain: Secondary | ICD-10-CM | POA: Diagnosis not present

## 2019-09-07 DIAGNOSIS — I252 Old myocardial infarction: Secondary | ICD-10-CM | POA: Diagnosis not present

## 2019-09-07 DIAGNOSIS — T8132XD Disruption of internal operation (surgical) wound, not elsewhere classified, subsequent encounter: Secondary | ICD-10-CM | POA: Diagnosis not present

## 2019-09-07 DIAGNOSIS — K659 Peritonitis, unspecified: Secondary | ICD-10-CM | POA: Diagnosis not present

## 2019-09-07 DIAGNOSIS — J449 Chronic obstructive pulmonary disease, unspecified: Secondary | ICD-10-CM | POA: Diagnosis not present

## 2019-09-07 DIAGNOSIS — I2510 Atherosclerotic heart disease of native coronary artery without angina pectoris: Secondary | ICD-10-CM | POA: Diagnosis not present

## 2019-09-08 DIAGNOSIS — I10 Essential (primary) hypertension: Secondary | ICD-10-CM | POA: Diagnosis not present

## 2019-09-08 DIAGNOSIS — N739 Female pelvic inflammatory disease, unspecified: Secondary | ICD-10-CM | POA: Diagnosis not present

## 2019-09-08 DIAGNOSIS — T8131XA Disruption of external operation (surgical) wound, not elsewhere classified, initial encounter: Secondary | ICD-10-CM | POA: Diagnosis not present

## 2019-09-08 DIAGNOSIS — J449 Chronic obstructive pulmonary disease, unspecified: Secondary | ICD-10-CM | POA: Diagnosis not present

## 2019-09-08 DIAGNOSIS — I252 Old myocardial infarction: Secondary | ICD-10-CM | POA: Diagnosis not present

## 2019-09-08 DIAGNOSIS — T8130XA Disruption of wound, unspecified, initial encounter: Secondary | ICD-10-CM | POA: Diagnosis not present

## 2019-09-09 DIAGNOSIS — G8929 Other chronic pain: Secondary | ICD-10-CM | POA: Diagnosis not present

## 2019-09-09 DIAGNOSIS — I2510 Atherosclerotic heart disease of native coronary artery without angina pectoris: Secondary | ICD-10-CM | POA: Diagnosis not present

## 2019-09-09 DIAGNOSIS — T8132XD Disruption of internal operation (surgical) wound, not elsewhere classified, subsequent encounter: Secondary | ICD-10-CM | POA: Diagnosis not present

## 2019-09-09 DIAGNOSIS — I252 Old myocardial infarction: Secondary | ICD-10-CM | POA: Diagnosis not present

## 2019-09-09 DIAGNOSIS — J449 Chronic obstructive pulmonary disease, unspecified: Secondary | ICD-10-CM | POA: Diagnosis not present

## 2019-09-09 DIAGNOSIS — K659 Peritonitis, unspecified: Secondary | ICD-10-CM | POA: Diagnosis not present

## 2019-09-11 DIAGNOSIS — G8929 Other chronic pain: Secondary | ICD-10-CM | POA: Diagnosis not present

## 2019-09-11 DIAGNOSIS — G629 Polyneuropathy, unspecified: Secondary | ICD-10-CM | POA: Diagnosis not present

## 2019-09-11 DIAGNOSIS — Z7982 Long term (current) use of aspirin: Secondary | ICD-10-CM | POA: Diagnosis not present

## 2019-09-11 DIAGNOSIS — I2510 Atherosclerotic heart disease of native coronary artery without angina pectoris: Secondary | ICD-10-CM | POA: Diagnosis not present

## 2019-09-11 DIAGNOSIS — G4733 Obstructive sleep apnea (adult) (pediatric): Secondary | ICD-10-CM | POA: Diagnosis not present

## 2019-09-11 DIAGNOSIS — D500 Iron deficiency anemia secondary to blood loss (chronic): Secondary | ICD-10-CM | POA: Diagnosis not present

## 2019-09-11 DIAGNOSIS — I252 Old myocardial infarction: Secondary | ICD-10-CM | POA: Diagnosis not present

## 2019-09-11 DIAGNOSIS — K659 Peritonitis, unspecified: Secondary | ICD-10-CM | POA: Diagnosis not present

## 2019-09-11 DIAGNOSIS — F17210 Nicotine dependence, cigarettes, uncomplicated: Secondary | ICD-10-CM | POA: Diagnosis not present

## 2019-09-11 DIAGNOSIS — I10 Essential (primary) hypertension: Secondary | ICD-10-CM | POA: Diagnosis not present

## 2019-09-11 DIAGNOSIS — K219 Gastro-esophageal reflux disease without esophagitis: Secondary | ICD-10-CM | POA: Diagnosis not present

## 2019-09-11 DIAGNOSIS — Z6841 Body Mass Index (BMI) 40.0 and over, adult: Secondary | ICD-10-CM | POA: Diagnosis not present

## 2019-09-11 DIAGNOSIS — E7800 Pure hypercholesterolemia, unspecified: Secondary | ICD-10-CM | POA: Diagnosis not present

## 2019-09-11 DIAGNOSIS — G44099 Other trigeminal autonomic cephalgias (TAC), not intractable: Secondary | ICD-10-CM | POA: Diagnosis not present

## 2019-09-11 DIAGNOSIS — T8132XD Disruption of internal operation (surgical) wound, not elsewhere classified, subsequent encounter: Secondary | ICD-10-CM | POA: Diagnosis not present

## 2019-09-11 DIAGNOSIS — J449 Chronic obstructive pulmonary disease, unspecified: Secondary | ICD-10-CM | POA: Diagnosis not present

## 2019-09-11 DIAGNOSIS — F418 Other specified anxiety disorders: Secondary | ICD-10-CM | POA: Diagnosis not present

## 2019-09-20 DIAGNOSIS — I252 Old myocardial infarction: Secondary | ICD-10-CM | POA: Diagnosis not present

## 2019-09-20 DIAGNOSIS — J449 Chronic obstructive pulmonary disease, unspecified: Secondary | ICD-10-CM | POA: Diagnosis not present

## 2019-09-20 DIAGNOSIS — T8132XD Disruption of internal operation (surgical) wound, not elsewhere classified, subsequent encounter: Secondary | ICD-10-CM | POA: Diagnosis not present

## 2019-09-20 DIAGNOSIS — G8929 Other chronic pain: Secondary | ICD-10-CM | POA: Diagnosis not present

## 2019-09-20 DIAGNOSIS — K659 Peritonitis, unspecified: Secondary | ICD-10-CM | POA: Diagnosis not present

## 2019-09-20 DIAGNOSIS — I2510 Atherosclerotic heart disease of native coronary artery without angina pectoris: Secondary | ICD-10-CM | POA: Diagnosis not present

## 2019-09-22 DIAGNOSIS — I2510 Atherosclerotic heart disease of native coronary artery without angina pectoris: Secondary | ICD-10-CM | POA: Diagnosis not present

## 2019-09-22 DIAGNOSIS — J449 Chronic obstructive pulmonary disease, unspecified: Secondary | ICD-10-CM | POA: Diagnosis not present

## 2019-09-22 DIAGNOSIS — G8929 Other chronic pain: Secondary | ICD-10-CM | POA: Diagnosis not present

## 2019-09-22 DIAGNOSIS — I252 Old myocardial infarction: Secondary | ICD-10-CM | POA: Diagnosis not present

## 2019-09-22 DIAGNOSIS — T8132XD Disruption of internal operation (surgical) wound, not elsewhere classified, subsequent encounter: Secondary | ICD-10-CM | POA: Diagnosis not present

## 2019-09-22 DIAGNOSIS — K659 Peritonitis, unspecified: Secondary | ICD-10-CM | POA: Diagnosis not present

## 2019-09-26 DIAGNOSIS — J449 Chronic obstructive pulmonary disease, unspecified: Secondary | ICD-10-CM | POA: Diagnosis not present

## 2019-09-26 DIAGNOSIS — K659 Peritonitis, unspecified: Secondary | ICD-10-CM | POA: Diagnosis not present

## 2019-09-26 DIAGNOSIS — I2510 Atherosclerotic heart disease of native coronary artery without angina pectoris: Secondary | ICD-10-CM | POA: Diagnosis not present

## 2019-09-26 DIAGNOSIS — G8929 Other chronic pain: Secondary | ICD-10-CM | POA: Diagnosis not present

## 2019-09-26 DIAGNOSIS — T8132XD Disruption of internal operation (surgical) wound, not elsewhere classified, subsequent encounter: Secondary | ICD-10-CM | POA: Diagnosis not present

## 2019-09-26 DIAGNOSIS — I252 Old myocardial infarction: Secondary | ICD-10-CM | POA: Diagnosis not present

## 2019-09-29 DIAGNOSIS — I10 Essential (primary) hypertension: Secondary | ICD-10-CM | POA: Diagnosis not present

## 2019-09-29 DIAGNOSIS — Z6841 Body Mass Index (BMI) 40.0 and over, adult: Secondary | ICD-10-CM | POA: Diagnosis not present

## 2019-09-29 DIAGNOSIS — F419 Anxiety disorder, unspecified: Secondary | ICD-10-CM | POA: Diagnosis not present

## 2019-09-29 DIAGNOSIS — Z20822 Contact with and (suspected) exposure to covid-19: Secondary | ICD-10-CM | POA: Diagnosis not present

## 2019-09-29 DIAGNOSIS — I252 Old myocardial infarction: Secondary | ICD-10-CM | POA: Diagnosis not present

## 2019-09-29 DIAGNOSIS — I2510 Atherosclerotic heart disease of native coronary artery without angina pectoris: Secondary | ICD-10-CM | POA: Diagnosis not present

## 2019-09-29 DIAGNOSIS — Z885 Allergy status to narcotic agent status: Secondary | ICD-10-CM | POA: Diagnosis not present

## 2019-09-29 DIAGNOSIS — J449 Chronic obstructive pulmonary disease, unspecified: Secondary | ICD-10-CM | POA: Diagnosis not present

## 2019-09-29 DIAGNOSIS — Z7982 Long term (current) use of aspirin: Secondary | ICD-10-CM | POA: Diagnosis not present

## 2019-09-29 DIAGNOSIS — T8130XA Disruption of wound, unspecified, initial encounter: Secondary | ICD-10-CM | POA: Diagnosis not present

## 2019-09-29 DIAGNOSIS — T8131XA Disruption of external operation (surgical) wound, not elsewhere classified, initial encounter: Secondary | ICD-10-CM | POA: Diagnosis not present

## 2019-09-29 DIAGNOSIS — F329 Major depressive disorder, single episode, unspecified: Secondary | ICD-10-CM | POA: Diagnosis not present

## 2019-10-03 DIAGNOSIS — K659 Peritonitis, unspecified: Secondary | ICD-10-CM | POA: Diagnosis not present

## 2019-10-03 DIAGNOSIS — I2510 Atherosclerotic heart disease of native coronary artery without angina pectoris: Secondary | ICD-10-CM | POA: Diagnosis not present

## 2019-10-03 DIAGNOSIS — T8132XD Disruption of internal operation (surgical) wound, not elsewhere classified, subsequent encounter: Secondary | ICD-10-CM | POA: Diagnosis not present

## 2019-10-03 DIAGNOSIS — I252 Old myocardial infarction: Secondary | ICD-10-CM | POA: Diagnosis not present

## 2019-10-03 DIAGNOSIS — J449 Chronic obstructive pulmonary disease, unspecified: Secondary | ICD-10-CM | POA: Diagnosis not present

## 2019-10-03 DIAGNOSIS — G8929 Other chronic pain: Secondary | ICD-10-CM | POA: Diagnosis not present

## 2019-10-06 DIAGNOSIS — K659 Peritonitis, unspecified: Secondary | ICD-10-CM | POA: Diagnosis not present

## 2019-10-06 DIAGNOSIS — T8132XD Disruption of internal operation (surgical) wound, not elsewhere classified, subsequent encounter: Secondary | ICD-10-CM | POA: Diagnosis not present

## 2019-10-06 DIAGNOSIS — I252 Old myocardial infarction: Secondary | ICD-10-CM | POA: Diagnosis not present

## 2019-10-06 DIAGNOSIS — J449 Chronic obstructive pulmonary disease, unspecified: Secondary | ICD-10-CM | POA: Diagnosis not present

## 2019-10-06 DIAGNOSIS — G8929 Other chronic pain: Secondary | ICD-10-CM | POA: Diagnosis not present

## 2019-10-06 DIAGNOSIS — I2510 Atherosclerotic heart disease of native coronary artery without angina pectoris: Secondary | ICD-10-CM | POA: Diagnosis not present

## 2019-11-04 IMAGING — CT CT HEAD W/O CM
3 series · 16 of 47 positions shown, 19 images · non-contrast
Comparison: June 08, 2014

CLINICAL DATA: Patient unable to move her arms and legs. Near
syncopal event.

EXAM:
CT HEAD WITHOUT CONTRAST
TECHNIQUE: Contiguous axial images were obtained from the base of the skull
through the vertex without intravenous contrast.

[Series 2: head wo · axial · 0.40mm/px · z∈[+206,+341]mm · 10 of 33 slices shown, 13 images]
[im 3/33  brain]
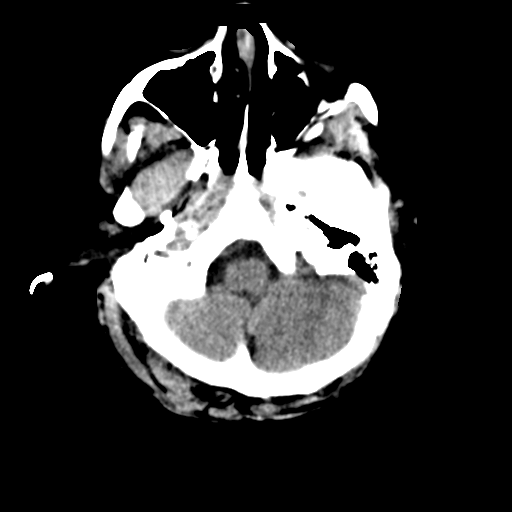
[im 3/33  bone]
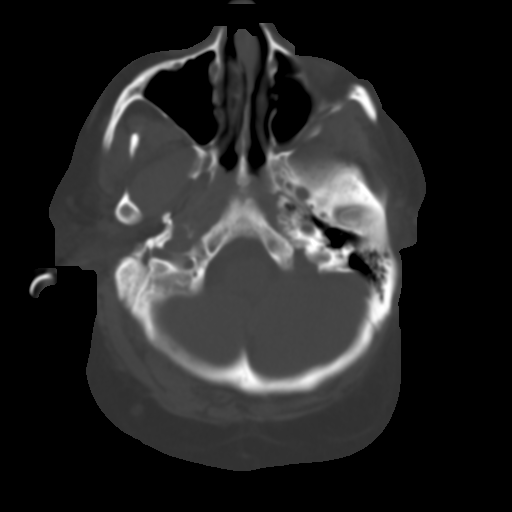
[im 6/33  brain]
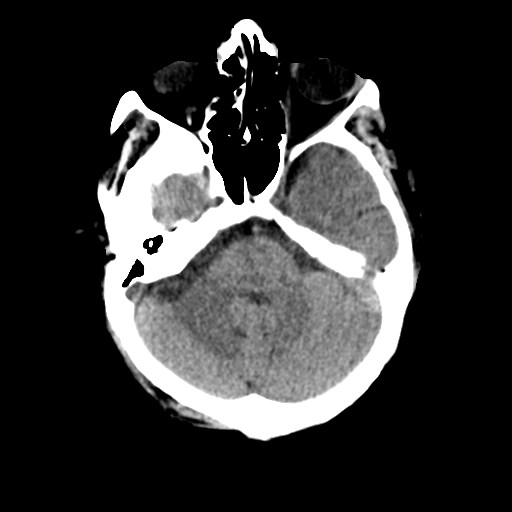
[im 9/33  brain]
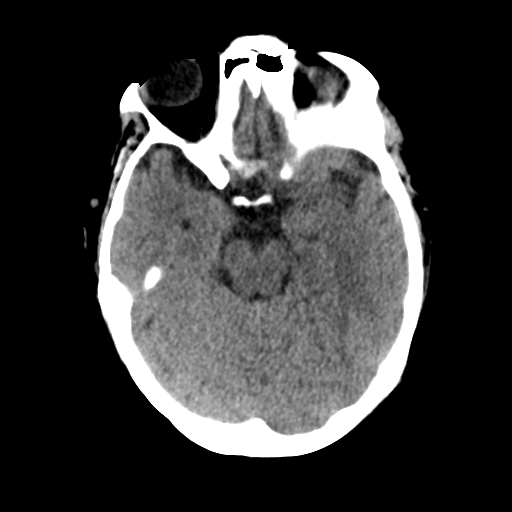
[im 12/33  brain]
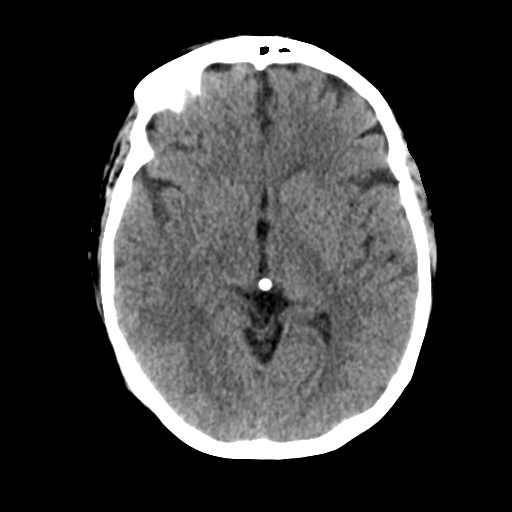
[im 15/33  brain]
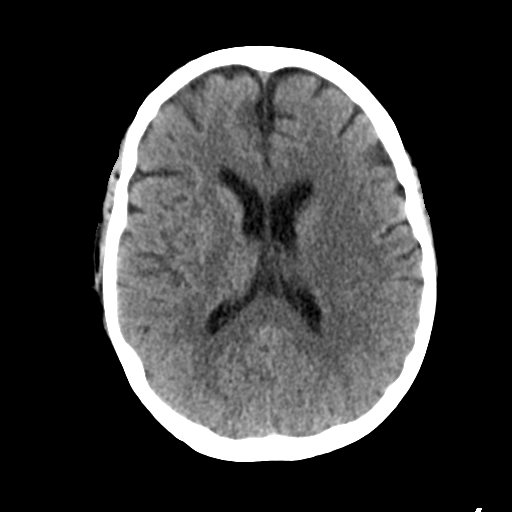
[im 15/33  bone]
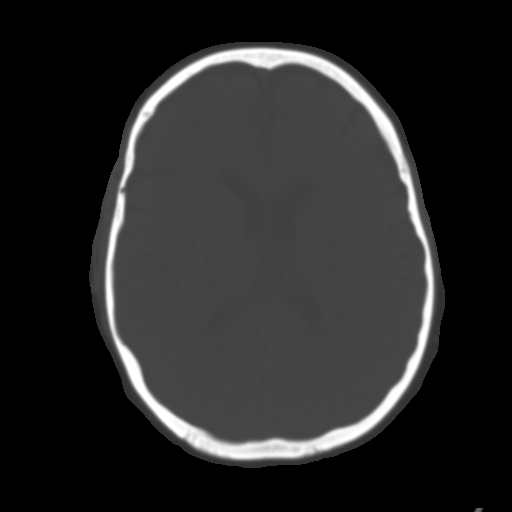
[im 18/33  brain]
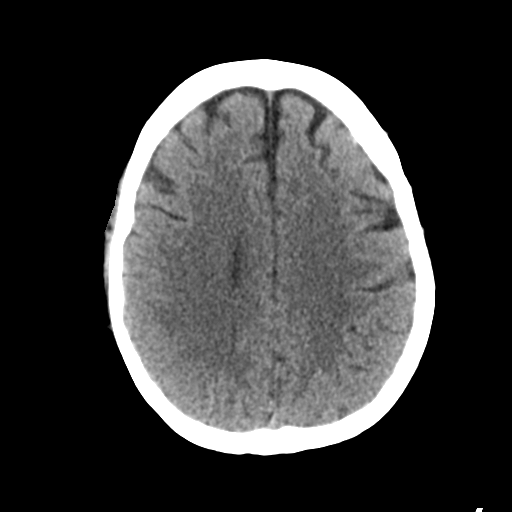
[im 21/33  brain]
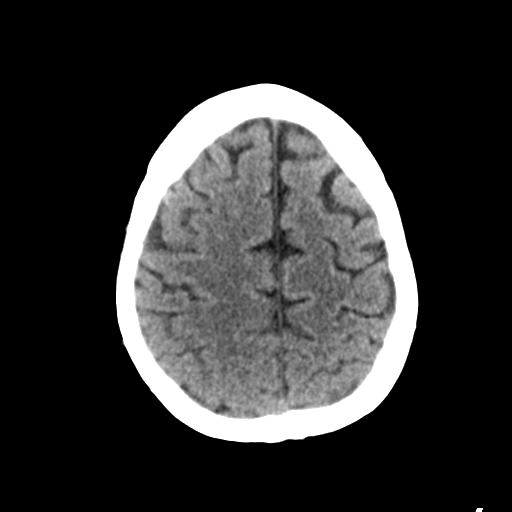
[im 25/33  brain]
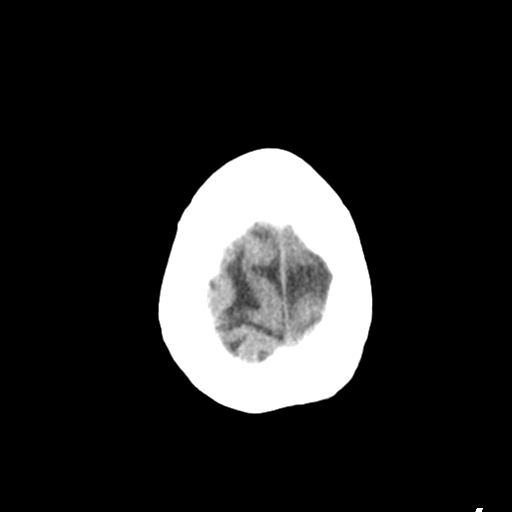
[im 27/33  brain]
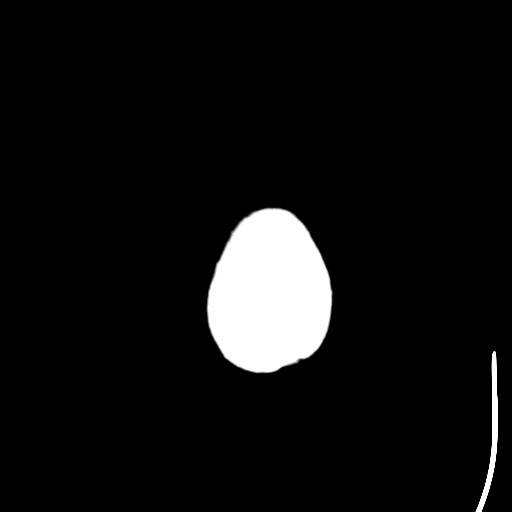
[im 27/33  bone]
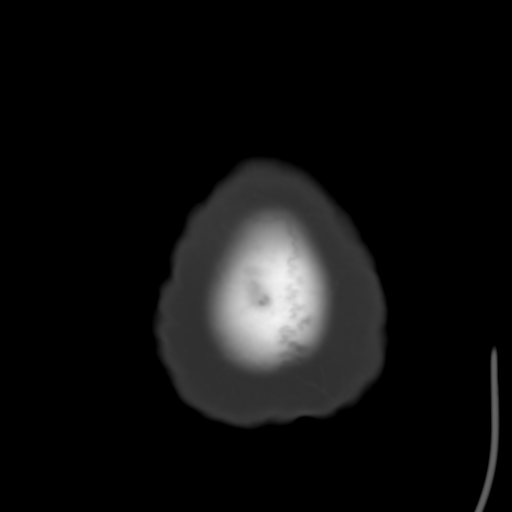
[im 30/33  brain]
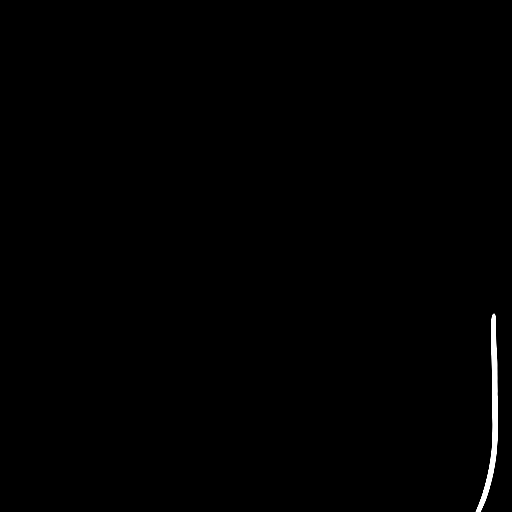

[Series 4: coronal soft tissue · coronal · 0.33mm/px · 3 of 66 slices shown]
[im 22/66  brain]
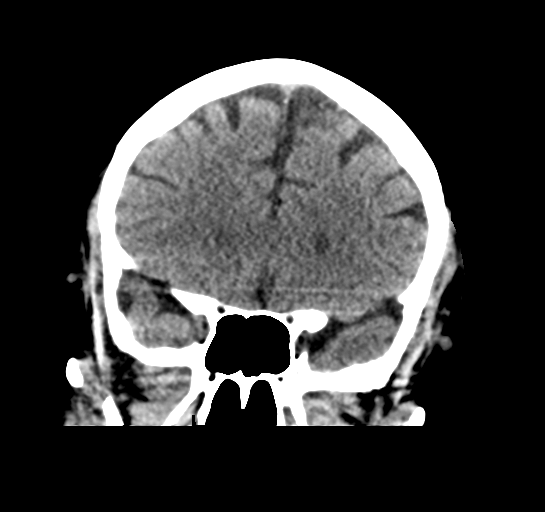
[im 29/66  brain]
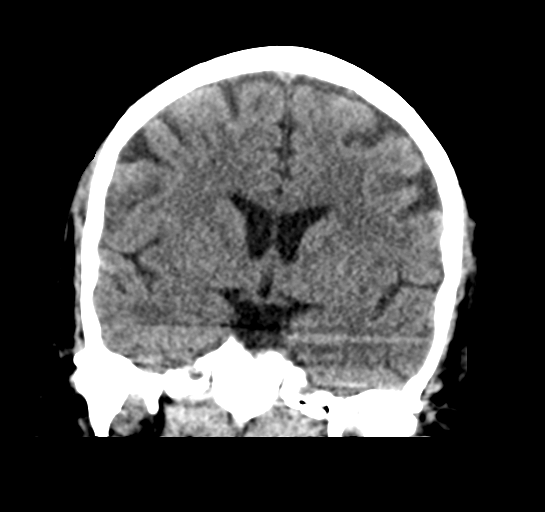
[im 37/66  brain]
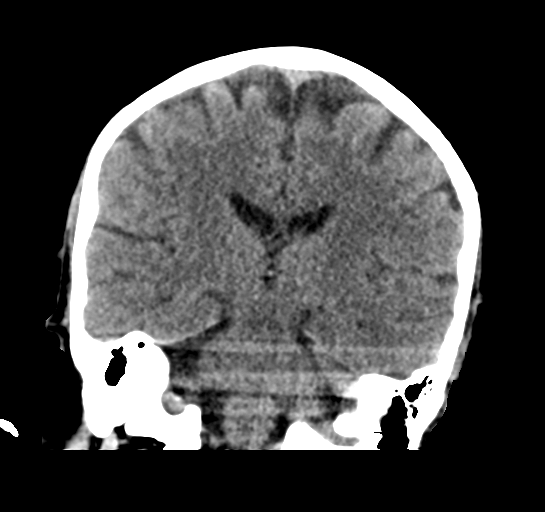

[Series 5: sagittal soft tissue · sagittal · 0.33mm/px · 3 of 67 slices shown]
[im 23/67  brain]
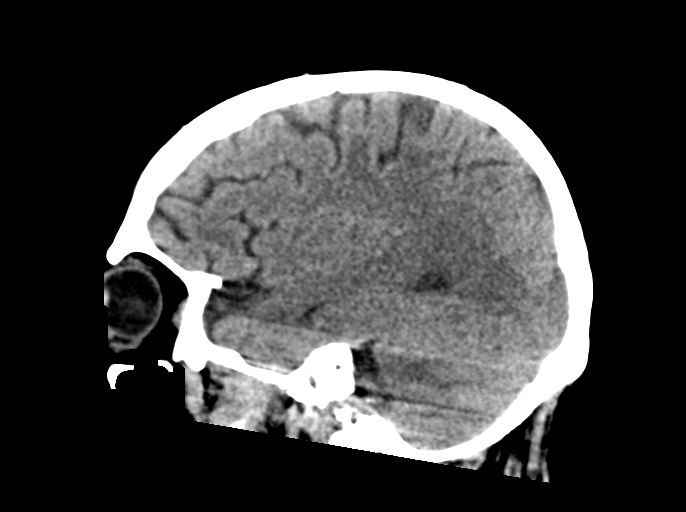
[im 34/67  brain]
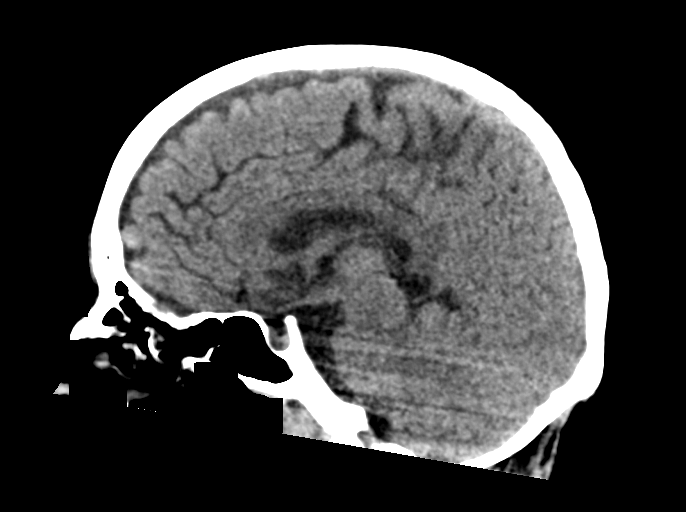
[im 45/67  brain]
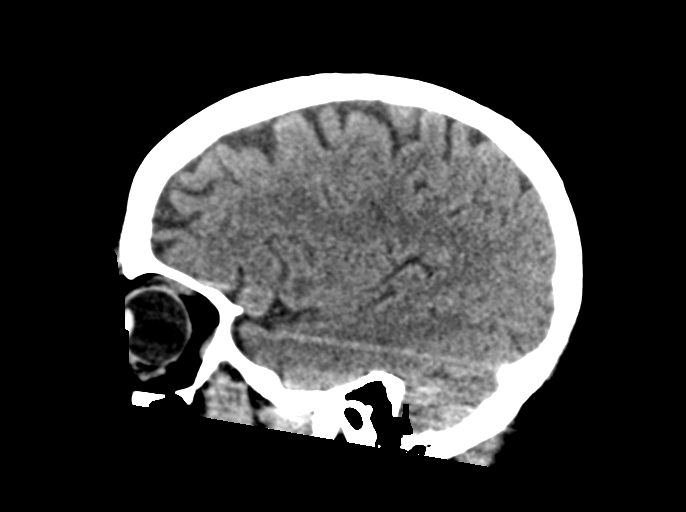

[16 of 47 positions shown; findings below may reference images not displayed]

FINDINGS: Brain: No evidence of acute infarction, hemorrhage, hydrocephalus,
extra-axial collection or mass lesion/mass effect.

Vascular: No hyperdense vessel or unexpected calcification.

Skull: Normal. Negative for fracture or focal lesion.

Sinuses/Orbits: No acute finding.

Other: None.
IMPRESSION: No cause for the patient's symptoms identified. No acute
intracranial abnormality.

## 2019-11-04 IMAGING — CR DG CHEST 1V PORT
1 series · 1 of 1 positions shown · non-contrast
Comparison: None.

CLINICAL DATA: Altered mental status

EXAM:
PORTABLE CHEST 1 VIEW

[ap]
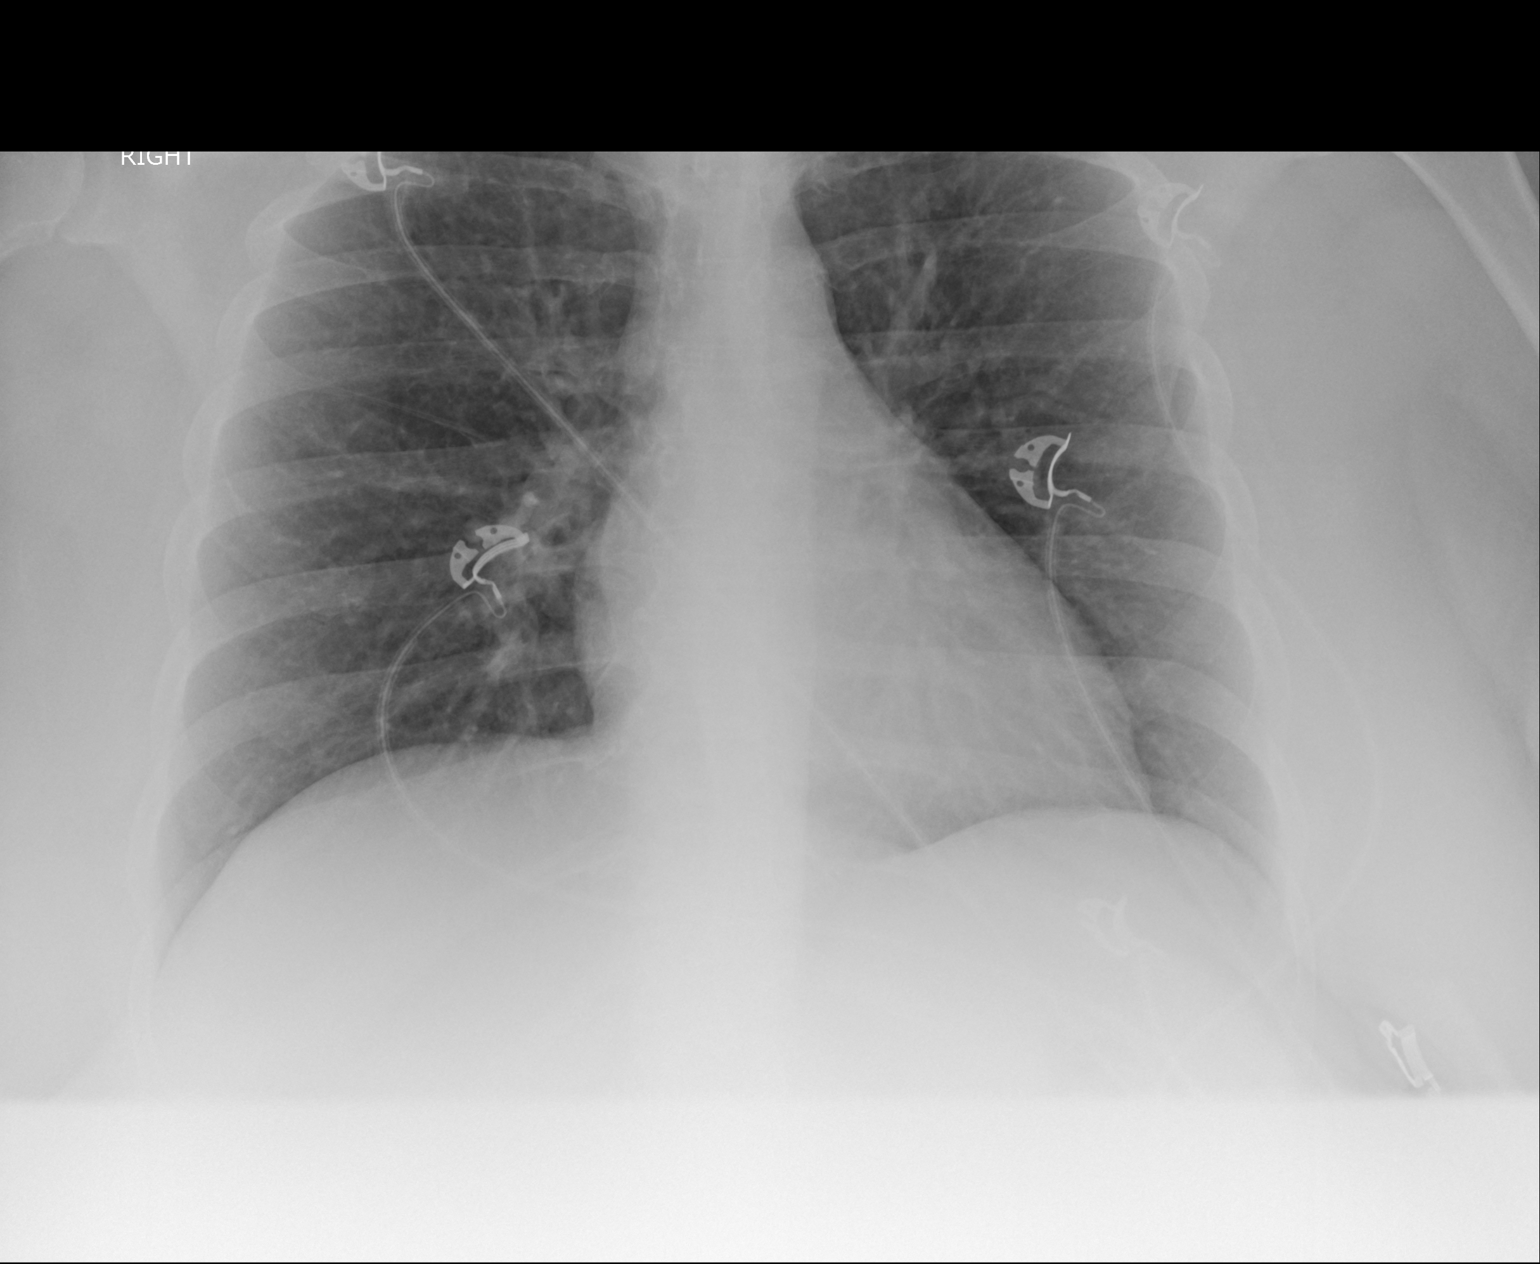

[1 of 1 positions shown; findings below may reference images not displayed]

FINDINGS: Normal heart size. Lungs clear. No pneumothorax. No pleural
effusion.
IMPRESSION: No active disease.

## 2020-01-11 DIAGNOSIS — W19XXXA Unspecified fall, initial encounter: Secondary | ICD-10-CM | POA: Diagnosis not present

## 2020-01-11 DIAGNOSIS — R52 Pain, unspecified: Secondary | ICD-10-CM | POA: Diagnosis not present

## 2020-03-22 ENCOUNTER — Other Ambulatory Visit (HOSPITAL_COMMUNITY): Payer: Self-pay | Admitting: Neurology

## 2020-03-22 DIAGNOSIS — R1900 Intra-abdominal and pelvic swelling, mass and lump, unspecified site: Secondary | ICD-10-CM

## 2020-04-04 ENCOUNTER — Other Ambulatory Visit: Payer: Self-pay | Admitting: Neurology

## 2020-04-04 ENCOUNTER — Other Ambulatory Visit (HOSPITAL_COMMUNITY): Payer: Self-pay | Admitting: Neurology

## 2020-04-04 DIAGNOSIS — R1900 Intra-abdominal and pelvic swelling, mass and lump, unspecified site: Secondary | ICD-10-CM

## 2020-04-11 ENCOUNTER — Ambulatory Visit (HOSPITAL_COMMUNITY): Admission: RE | Admit: 2020-04-11 | Payer: Medicare Other | Source: Ambulatory Visit

## 2020-04-11 ENCOUNTER — Encounter (HOSPITAL_COMMUNITY): Payer: Self-pay

## 2020-05-24 ENCOUNTER — Ambulatory Visit (HOSPITAL_COMMUNITY): Payer: Medicare Other

## 2022-04-27 DIAGNOSIS — G500 Trigeminal neuralgia: Secondary | ICD-10-CM | POA: Diagnosis not present

## 2022-04-27 DIAGNOSIS — F17200 Nicotine dependence, unspecified, uncomplicated: Secondary | ICD-10-CM | POA: Diagnosis not present

## 2022-04-27 DIAGNOSIS — G43019 Migraine without aura, intractable, without status migrainosus: Secondary | ICD-10-CM | POA: Diagnosis not present

## 2023-09-06 ENCOUNTER — Institutional Professional Consult (permissible substitution): Admitting: Plastic Surgery
